# Patient Record
Sex: Female | Born: 1940 | Race: White | Hispanic: No | Marital: Married | Smoking: Never smoker
Health system: Southern US, Community
[De-identification: ages and names within clinical notes are randomized; demographics above are authoritative.]

## PROBLEM LIST (undated history)

## (undated) DIAGNOSIS — R112 Nausea with vomiting, unspecified: Secondary | ICD-10-CM

## (undated) DIAGNOSIS — N183 Chronic kidney disease, stage 3 unspecified: Secondary | ICD-10-CM

## (undated) DIAGNOSIS — Z9889 Other specified postprocedural states: Secondary | ICD-10-CM

## (undated) DIAGNOSIS — R011 Cardiac murmur, unspecified: Secondary | ICD-10-CM

## (undated) DIAGNOSIS — M6740 Ganglion, unspecified site: Secondary | ICD-10-CM

## (undated) DIAGNOSIS — M1990 Unspecified osteoarthritis, unspecified site: Secondary | ICD-10-CM

## (undated) DIAGNOSIS — I10 Essential (primary) hypertension: Secondary | ICD-10-CM

## (undated) DIAGNOSIS — F419 Anxiety disorder, unspecified: Secondary | ICD-10-CM

## (undated) DIAGNOSIS — E7800 Pure hypercholesterolemia, unspecified: Secondary | ICD-10-CM

## (undated) DIAGNOSIS — K219 Gastro-esophageal reflux disease without esophagitis: Secondary | ICD-10-CM

## (undated) HISTORY — DX: Essential (primary) hypertension: I10

## (undated) HISTORY — DX: Anxiety disorder, unspecified: F41.9

## (undated) HISTORY — PX: BLADDER SUSPENSION: SHX72

## (undated) HISTORY — DX: Chronic kidney disease, stage 3 (moderate): N18.3

## (undated) HISTORY — PX: CATARACT EXTRACTION W/ INTRAOCULAR LENS  IMPLANT, BILATERAL: SHX1307

## (undated) HISTORY — DX: Cardiac murmur, unspecified: R01.1

## (undated) HISTORY — DX: Unspecified osteoarthritis, unspecified site: M19.90

## (undated) HISTORY — PX: TONSILLECTOMY: SUR1361

## (undated) HISTORY — DX: Chronic kidney disease, stage 3 unspecified: N18.30

## (undated) HISTORY — DX: Gastro-esophageal reflux disease without esophagitis: K21.9

## (undated) HISTORY — PX: APPENDECTOMY: SHX54

## (undated) HISTORY — PX: REFRACTIVE SURGERY: SHX103

## (undated) HISTORY — PX: ESOPHAGOGASTRODUODENOSCOPY: SHX1529

## (undated) HISTORY — PX: WISDOM TOOTH EXTRACTION: SHX21

---

## 2001-07-08 ENCOUNTER — Other Ambulatory Visit: Admission: RE | Admit: 2001-07-08 | Discharge: 2001-07-08 | Payer: Self-pay | Admitting: Family Medicine

## 2002-10-29 ENCOUNTER — Other Ambulatory Visit: Admission: RE | Admit: 2002-10-29 | Discharge: 2002-10-29 | Payer: Self-pay | Admitting: Obstetrics and Gynecology

## 2003-02-28 ENCOUNTER — Encounter: Payer: Self-pay | Admitting: Obstetrics and Gynecology

## 2003-03-07 ENCOUNTER — Inpatient Hospital Stay (HOSPITAL_COMMUNITY): Admission: RE | Admit: 2003-03-07 | Discharge: 2003-03-09 | Payer: Self-pay | Admitting: Obstetrics and Gynecology

## 2003-03-07 ENCOUNTER — Encounter (INDEPENDENT_AMBULATORY_CARE_PROVIDER_SITE_OTHER): Payer: Self-pay

## 2003-03-07 HISTORY — PX: BILATERAL SALPINGOOPHORECTOMY: SHX1223

## 2003-03-07 HISTORY — PX: VAGINAL HYSTERECTOMY: SHX2639

## 2003-03-07 HISTORY — PX: BLADDER SUSPENSION: SHX72

## 2003-03-07 HISTORY — PX: BLADDER REPAIR: SHX76

## 2005-01-31 ENCOUNTER — Encounter: Admission: RE | Admit: 2005-01-31 | Discharge: 2005-02-21 | Payer: Self-pay | Admitting: Family Medicine

## 2005-03-07 ENCOUNTER — Other Ambulatory Visit: Admission: RE | Admit: 2005-03-07 | Discharge: 2005-03-07 | Payer: Self-pay | Admitting: Obstetrics and Gynecology

## 2005-11-14 ENCOUNTER — Encounter: Admission: RE | Admit: 2005-11-14 | Discharge: 2005-12-03 | Payer: Self-pay | Admitting: Family Medicine

## 2009-03-23 ENCOUNTER — Encounter (INDEPENDENT_AMBULATORY_CARE_PROVIDER_SITE_OTHER): Payer: Self-pay | Admitting: Orthopedic Surgery

## 2009-03-23 ENCOUNTER — Ambulatory Visit (HOSPITAL_BASED_OUTPATIENT_CLINIC_OR_DEPARTMENT_OTHER): Admission: RE | Admit: 2009-03-23 | Discharge: 2009-03-23 | Payer: Self-pay | Admitting: Orthopedic Surgery

## 2009-03-23 HISTORY — PX: TRIGGER FINGER RELEASE: SHX641

## 2009-03-23 HISTORY — PX: DUPUYTREN / PALMAR FASCIOTOMY: SUR601

## 2009-06-20 ENCOUNTER — Ambulatory Visit (HOSPITAL_COMMUNITY): Admission: RE | Admit: 2009-06-20 | Discharge: 2009-06-20 | Payer: Self-pay | Admitting: Gastroenterology

## 2009-07-24 ENCOUNTER — Ambulatory Visit (HOSPITAL_COMMUNITY): Admission: RE | Admit: 2009-07-24 | Discharge: 2009-07-24 | Payer: Self-pay | Admitting: *Deleted

## 2011-02-05 LAB — BASIC METABOLIC PANEL
BUN: 13 mg/dL (ref 6–23)
Calcium: 9.8 mg/dL (ref 8.4–10.5)
Chloride: 104 mEq/L (ref 96–112)
Creatinine, Ser: 1.03 mg/dL (ref 0.4–1.2)

## 2011-02-05 LAB — POCT HEMOGLOBIN-HEMACUE: Hemoglobin: 12.6 g/dL (ref 12.0–15.0)

## 2011-03-12 NOTE — Op Note (Signed)
NAMEJUNIOR, KENEDY         ACCOUNT NO.:  [id number]   MEDICAL RECORD NO.:  [id number]          PATIENT TYPE:  AMB   LOCATION:  DSC                          FACILITY:  [facility]   PHYSICIAN:  Katy Fitch. Sypher, M.D. DATE OF BIRTH:  1941-07-02   DATE OF PROCEDURE:  03/23/2009  DATE OF DISCHARGE:                               OPERATIVE REPORT   PREOPERATIVE DIAGNOSES:  1. Stenosing tenosynovitis of right index finger.  2. Stenosing tenosynovitis of right long finger.  3. 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   POSTOPERATIVE DIAGNOSES:  1. Stenosing tenosynovitis of right index finger.  2. Stenosing tenosynovitis of right long finger.  3. Palmar fibromatosis pretendinous fibers, right long finger.   OPERATION:  1. Excision of palmar fascia including pretendinous fibers to right      long finger and palm.  2. Release of right long finger A1 pulley with synovectomy of      superficialis and profundus tendons.  3. Release of A1 pulley, right index finger with synovectomy of      superficialis and profundus tendons.   OPERATING SURGEON:  Katy Fitch. Sypher, MD   ASSISTANT:  Annye Rusk, PA-C   ANESTHESIA:  2% lidocaine palmar block involving flexor sheath of right  index and long fingers and region of abnormal palmar fascia, right palm.   INDICATIONS:  Kaitlyn Newton is a 70 year old woman referred  through the courtesy of Dr. 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  Clinical examination revealed locking trigger fingers.  She  had very hypertrophic synovium.  We discussed treatment options in  detail.  Given the fact that she had progressive palmar fibromatosis,  she elected to proceed with release of A1 pulleys and excision of her  palmar fascia.   She is brought to the operating at this time anticipating this  procedure.   Preoperatively, she was  advised that with the genetic predisposition to  develop palmar fibromatosis that she will and underlying will develop  more palmar fascia nodules over time.   After informed consent, she was brought to the operating room at this  time.   PROCEDURE NOTE:  Kaitlyn Newton was brought to the operating  room and placed in supine position upon on the operating table.  Following light sedation, the right arm was prepped with Betadine and 2%  lidocaine infiltrated in the region of the flexor sheaths of the index  and long fingers as well as around the palmar fascia nodules.  The arm  was then formally prepped with Betadine soap solution and sterilely  draped.  Following exsanguination of the right arm with an Esmarch  bandage, the arterial tourniquet was inflated to 220 mmHg and later  elevated to 240 mmHg due to mild systolic hypertension.   Procedure commenced with an oblique incision over the A1 pulley of the  right index finger.  Subcutaneous tissues were carefully cleared off the  A1 pulley followed by release of the pulley with scalpel and scissors.  There was a large cuff of fibrotic tenosynovium proximal at  the pulley.  The tendons delivered and a synovectomy of the superficialis and  profundus tendon was accomplished.   Attention was then directed to the long finger.  A Brunner zigzag  incision was fashioned exposing the A1 pulley and proximally into the  palm to completely expose the nodule of pathologic palmar fascia.   A meticulous dissection of the pretendinous fibers of the palmar fascia  was accomplished removing the pathologic nodule and relieving the MP  flexion contracture.  The A1 pulley was then split with scalpel and  scissors along its radial border.  Tendons were delivered and a cuff of  fibrotic tenosynovium removed from the superficialis and profundus  tendons.  Thereafter, full active range of motion of the fingers was  recovered.   The wounds were  then repaired with corner sutures of 5-0 nylon and  interrupted sutures of 5-0 nylon.   A compressive dressing was applied with Steri-Strips, sterile gauze, and  Ace wrap.  There were no apparent complications.  Kaitlyn Newton  tolerated the surgery and anesthesia well.  To return to see [place] in the  office for followup in approximately 1 week.  At that time, we will  change of dressing and removed a portion of her ulnar sutures.      Katy Fitch Sypher, M.D.  Electronically Signed     RVS/MEDQ  D:  03/23/2009  T:  03/24/2009  Job:  [id number]   cc:   Chales Salmon. Abigail Miyamoto, M.D.

## 2011-03-15 NOTE — Op Note (Signed)
NAME:  Kaitlyn Newton, Kaitlyn Newton                ACCOUNT NO.:  [id number]   MEDICAL RECORD NO.:  [id number]                   PATIENT TYPE:  INP   LOCATION:  X010                                 FACILITY:  [facility]   PHYSICIAN:  Maretta Bees. Vonita Moss, M.D.             DATE OF BIRTH:  09-23-1941   DATE OF PROCEDURE:  03/07/2003  DATE OF DISCHARGE:                                 OPERATIVE REPORT   PREOPERATIVE DIAGNOSIS:  Stress urinary incontinence.   POSTOPERATIVE DIAGNOSIS:  Stress urinary incontinence.   PROCEDURE:  SPARC sling system insertion.   SURGEON:  Maretta Bees. Vonita Moss, M.D.   ANESTHESIA:  General.   INDICATIONS FOR PROCEDURE:  This 69 year old lady has had a past history of  stress urinary incontinence. She also has had a past history of chronic  cystitis and some frequency treated with Detrol LA. She has had some bladder  frequency in the past treated with Detrol LA.  She has definite symptoms of  stress incontinence. She is scheduled with Dr. Eda Paschal to undergo vaginal  hysterectomy and anterior repair and in conjunction with that because of the  stress incontinence, she will undergo a SPARC sling insertion.   DESCRIPTION OF PROCEDURE:  I scrubbed in the middle of the case and Dr.  Eda Paschal will dictate his vaginal hysterectomy and anterior repair  separately. I inserted a Foley catheter into the bladder per urethra. The  Foley catheter balloon was palpated and the urethra was identified and  isolated. Visual dissection up toward the endopelvic fascia bilaterally was  performed. Stab wounds were made at each side of the midline in the  suprapubic region and through each of these stab wounds, a SPARC needle  insertion device was placed down to the top of the symphysis pubis and then  marched along the back down to the endopelvic fascia which was then  perforated with the needle and the needle on each side was brought out  periurethrally. I then cystoscoped and there was  no evidence of bladder  injury or needle penetration of the bladder. The urethra was also noted to  be intact. I then snapped on the [facility] sling and brought it up on each side  through the stab wounds using the same needles. I then again cystoscoped and  there was no evidence of sling material in the bladder. I then pulled up the  sling after removing the plastic covers and positioned it so that it was in  mid urethra with a hemostat easily placed between the urethra and the sling  material. The excess sling material was cut off suprapubically and it  retracted into the suprapubic stab wounds  which were then irrigated with antibiotic solution. The vaginal area was  then irrigated with antibiotic solution and with the Foley catheter  reinserted, the sling was again noted to be in position in the mid urethra.  Dr. Eda Paschal then finished his surgery.  Maretta Bees. Vonita Moss, M.D.    LJP/MEDQ  D:  03/07/2003  T:  03/07/2003  Job:  [id number]   cc:   Reuel Boom L. Eda Paschal, M.D.  [address]  [place]  [place] [address]  Fax: [phone]

## 2011-03-15 NOTE — Discharge Summary (Signed)
   NAME:  Kaitlyn Newton, PINHO                ACCOUNT NO.:  [id number]   MEDICAL RECORD NO.:  [id number]                   PATIENT TYPE:  INP   LOCATION:  0448                                 FACILITY:  [facility]   PHYSICIAN:  Daniel L. Eda Paschal, M.D.           DATE OF BIRTH:  21-Apr-1941   DATE OF ADMISSION:  03/07/2003  DATE OF DISCHARGE:  03/09/2003                                 DISCHARGE SUMMARY   HISTORY:  Patient is a 70 year old female who was admitted to the hospital  with urinary stress incontinence, uterine prolapse, cystocele for definitive  surgery.  On the day of admission, she was taken to the operating room by  Dr. Eda Paschal and Dr. Vonita Moss; a vaginal hysterectomy, bilateral salpingo-  oophorectomy, and anterior repair were done by Dr. Eda Paschal, and a [facility]  sling procedure was done by Dr. Vonita Moss.   Postoperatively, the patient did well.  On the second postoperative day, her  catheter was removed, and she was voiding well.  She was discharged home on  Darvocet-N 100 for pain relief and on Cipro to cover her [facility] procedure.  She will be seen in Dr. Enos Fling office in two weeks and in  Dr. Verl Dicker office in four weeks.  Final pathology report revealed the  cervix with no pathological abnormalities, two benign endometrial polyps,  disordered proliferative endometrium, submucosal leiomyoma, adenomyosis,  uterine serosal endometriosis, bilateral ovarian fibrous adhesions, and  bilateral fallopian tubes with no pathological abnormalities.   DIET:  Regular.   ACTIVITY:  On discharge is ambulatory.   CONDITION ON DISCHARGE:  Improved.   DISCHARGE DIAGNOSES:  1. Uterine prolapse.  2. Cystocele.  3. Urinary stress incontinence.  4. Endometriosis.  5. Adenomyosis.  6. Submucous leiomyoma.  7. Endometrial polyps.   OPERATIONS:  1. Vaginal hysterectomy.  2.     Bilateral salpingo-oophorectomy.  3. Anterior repair.  4. SPARC sling procedure.                                        Daniel L. Eda Paschal, M.D.    Tonette Bihari  D:  03/29/2003  T:  03/29/2003  Job:  [id number]   cc:   Maretta Bees. Vonita Moss, M.D.  [address]  [place]  [place] [address]  Fax: [phone]

## 2011-03-15 NOTE — Op Note (Signed)
NAME:  Kaitlyn Newton, Kaitlyn Newton                ACCOUNT NO.:  [id number]   MEDICAL RECORD NO.:  [id number]                   PATIENT TYPE:  INP   LOCATION:  0448                                 FACILITY[facility]   PHYSICIAN:  Daniel L. Eda Paschal, M.D.           DATE OF BIRTH:  27-Jul-1941   DATE OF PROCEDURE:  03/07/2003  DATE OF DISCHARGE:                                 OPERATIVE REPORT   PREOPERATIVE DIAGNOSES:  1. Cystocele.  2. Uterine prolapse.  3. Urinary stress incontinence.   POSTOPERATIVE DIAGNOSES:  1. Cystocele.  2. Uterine prolapse.  3. Urinary stress incontinence.   OPERATION:  1. Vaginal hysterectomy.  2. Bilateral salpingo-oophorectomy.  3. Anterior repair.   SURGEON:  Daniel L. Eda Paschal, M.D.   FIRST ASSISTANT:  Ivor Costa. Farrel Gobble, M.D.   FINDINGS:  At the time of surgery the patient had second-degree uterine  descensus, with a normal-sized uterus.  There was no pathology when the  uterus was removed.  The patient had a 1.5 second-degree cystocele.  The  patient had no rectocele and no enterocele present.  She did have loss of  her urethral vesicle angle.  Ovaries and fallopian tubes were normal, post-  menopausal.   DESCRIPTION OF PROCEDURE:  After adequate general endotracheal anesthesia,  the patient was placed in the dorsal lithotomy position; prepped and draped  in usual sterile manner.  A 1:200,000 insertion of epinephrine and 0.5%  Xylocaine was injected around the cervix.  A 360-degree incision was made  around the cervix.  The bladder was mobilized superiorly, as was the  posterior peritoneum.  The posterior peritoneum and vesicouterine fold of  the perineum were entered by sharp dissection.  The uterosacral ligaments  were clamped, on clamping them they were shortened, and then they were  sutured to the vault laterally for good vault support.  Cardinal ligaments,  uterine arteries, balance of the broad ligament and utero-ovarian ligaments  and  round ligaments were successfully clamped, cut and suture ligated.  All  major vascular bundles were doubly ligated.  Suture material for the above-  mentioned pedicles was #1 chromic catgut.  The uterus was removed and sent  to pathology for tissue diagnosis.   The patient had requested we remove her ovaries and tubes, so that was done  next.  The IP ligaments were identified.  The ovary and tube were grasped  with a Tanja Port -- first on the left then on the right.  The IP ligaments  were clamped and cut, removing the ovaries and the tubes -- first on the  left then on the right.  Then they were doubly suture ligated with #1  chromic catgut.  At this point a modified McCall's enterocele prevention  suture was placed with 2-0 Vicryl.  The vaginal cuff was whipstitched to the  posterior peritoneum with a running, locking 0 Vicryl.  The anterior vaginal  mucosa was then undermined to the level of the urethra.  The perivesical  fascia and the cystocele were sharply dissected free from the mucosa.   At this point Dr. Maretta Bees. Peterson scrubbed in and did a Sparc procedure,  which is dictated in a separate operative note.  After he was finished, the  redundant vaginal mucosa was trimmed away.  The vesicovaginal fascia was  brought together with interrupted 2-0 Vicryl, and then the anterior vaginal  mucosa was closed with a running, locking 2-0 Vicryl.  Copious irrigation  was done with Ringer's lactate.  Two sponge, needle and instrument counts  were correct, and then the cuff was closed with figure-of-eight's and #1  chromic catgut.  The enterocele prevention suture was tied and placed.   The patient was reassessed.  She had good vault support.  She had no  enterocele and she had no rectocele, so the procedure was terminated at this  point.  The vagina was packed with one inch Iodoform.  The patient was  draining clear urine from her Foley catheter.   ESTIMATED BLOOD LOSS:  300 cc with none  replaced.   DISPOSITION:  The patient tolerated the procedure well and left the  operating room in satisfactory condition.                                               Daniel L. Eda Paschal, M.D.    Tonette Bihari  D:  03/07/2003  T:  03/08/2003  Job:  [id number]

## 2014-12-12 ENCOUNTER — Encounter: Payer: Self-pay | Admitting: *Deleted

## 2016-08-15 ENCOUNTER — Other Ambulatory Visit: Payer: Self-pay | Admitting: Orthopedic Surgery

## 2016-09-26 ENCOUNTER — Encounter (HOSPITAL_BASED_OUTPATIENT_CLINIC_OR_DEPARTMENT_OTHER): Payer: Self-pay

## 2016-09-26 ENCOUNTER — Ambulatory Visit (HOSPITAL_BASED_OUTPATIENT_CLINIC_OR_DEPARTMENT_OTHER): Admit: 2016-09-26 | Payer: Self-pay | Admitting: Orthopedic Surgery

## 2016-09-26 SURGERY — EXCISION MASS
Anesthesia: Choice | Laterality: Left

## 2016-10-29 DIAGNOSIS — E781 Pure hyperglyceridemia: Secondary | ICD-10-CM | POA: Diagnosis not present

## 2016-10-29 DIAGNOSIS — K219 Gastro-esophageal reflux disease without esophagitis: Secondary | ICD-10-CM | POA: Diagnosis not present

## 2016-10-29 DIAGNOSIS — E782 Mixed hyperlipidemia: Secondary | ICD-10-CM | POA: Diagnosis not present

## 2016-10-29 DIAGNOSIS — Z0000 Encounter for general adult medical examination without abnormal findings: Secondary | ICD-10-CM | POA: Diagnosis not present

## 2016-10-29 DIAGNOSIS — M85852 Other specified disorders of bone density and structure, left thigh: Secondary | ICD-10-CM | POA: Diagnosis not present

## 2016-10-29 DIAGNOSIS — M2550 Pain in unspecified joint: Secondary | ICD-10-CM | POA: Diagnosis not present

## 2016-10-29 DIAGNOSIS — H409 Unspecified glaucoma: Secondary | ICD-10-CM | POA: Diagnosis not present

## 2016-10-29 DIAGNOSIS — R69 Illness, unspecified: Secondary | ICD-10-CM | POA: Diagnosis not present

## 2016-10-29 DIAGNOSIS — I10 Essential (primary) hypertension: Secondary | ICD-10-CM | POA: Diagnosis not present

## 2016-10-29 DIAGNOSIS — R195 Other fecal abnormalities: Secondary | ICD-10-CM | POA: Diagnosis not present

## 2016-11-04 DIAGNOSIS — I739 Peripheral vascular disease, unspecified: Secondary | ICD-10-CM | POA: Diagnosis not present

## 2016-11-04 DIAGNOSIS — I10 Essential (primary) hypertension: Secondary | ICD-10-CM | POA: Diagnosis not present

## 2016-11-04 DIAGNOSIS — R69 Illness, unspecified: Secondary | ICD-10-CM | POA: Diagnosis not present

## 2016-11-04 DIAGNOSIS — N183 Chronic kidney disease, stage 3 (moderate): Secondary | ICD-10-CM | POA: Diagnosis not present

## 2016-11-04 DIAGNOSIS — Z01419 Encounter for gynecological examination (general) (routine) without abnormal findings: Secondary | ICD-10-CM | POA: Diagnosis not present

## 2016-11-04 DIAGNOSIS — Z7189 Other specified counseling: Secondary | ICD-10-CM | POA: Diagnosis not present

## 2016-11-04 DIAGNOSIS — E782 Mixed hyperlipidemia: Secondary | ICD-10-CM | POA: Diagnosis not present

## 2016-11-04 DIAGNOSIS — L84 Corns and callosities: Secondary | ICD-10-CM | POA: Diagnosis not present

## 2016-11-04 DIAGNOSIS — K219 Gastro-esophageal reflux disease without esophagitis: Secondary | ICD-10-CM | POA: Diagnosis not present

## 2016-11-04 DIAGNOSIS — Z0000 Encounter for general adult medical examination without abnormal findings: Secondary | ICD-10-CM | POA: Diagnosis not present

## 2016-12-26 ENCOUNTER — Encounter (HOSPITAL_BASED_OUTPATIENT_CLINIC_OR_DEPARTMENT_OTHER): Payer: Self-pay | Admitting: *Deleted

## 2016-12-26 DIAGNOSIS — M6740 Ganglion, unspecified site: Secondary | ICD-10-CM

## 2016-12-26 HISTORY — DX: Ganglion, unspecified site: M67.40

## 2016-12-26 NOTE — Pre-Procedure Instructions (Signed)
To come for BMET and EKG 

## 2016-12-27 ENCOUNTER — Encounter (HOSPITAL_BASED_OUTPATIENT_CLINIC_OR_DEPARTMENT_OTHER)
Admission: RE | Admit: 2016-12-27 | Discharge: 2016-12-27 | Disposition: A | Discharge status: Home / Self Care | Payer: Medicare HMO | Source: Ambulatory Visit | Attending: Orthopedic Surgery | Admitting: Orthopedic Surgery

## 2016-12-27 DIAGNOSIS — Z01812 Encounter for preprocedural laboratory examination: Secondary | ICD-10-CM | POA: Insufficient documentation

## 2016-12-27 DIAGNOSIS — I10 Essential (primary) hypertension: Secondary | ICD-10-CM | POA: Diagnosis not present

## 2016-12-27 DIAGNOSIS — Z01810 Encounter for preprocedural cardiovascular examination: Secondary | ICD-10-CM | POA: Diagnosis not present

## 2016-12-27 LAB — BASIC METABOLIC PANEL
Anion gap: 11 (ref 5–15)
BUN: 13 mg/dL (ref 6–20)
CO2: 23 mmol/L (ref 22–32)
Calcium: 9.5 mg/dL (ref 8.9–10.3)
Chloride: 97 mmol/L — ABNORMAL LOW (ref 101–111)
Creatinine, Ser: 0.93 mg/dL (ref 0.44–1.00)
GFR calc Af Amer: >60 mL/min (ref >60)
GFR, EST NON AFRICAN AMERICAN: 58 mL/min — AB (ref >60)
GLUCOSE: 88 mg/dL (ref 65–99)
POTASSIUM: 4 mmol/L (ref 3.5–5.1)
SODIUM: 131 mmol/L — AB (ref 135–145)

## 2016-12-27 NOTE — Progress Notes (Addendum)
Dr. Lissa Hoard reviewed EKG - [facility] for surgery. Dr. Lissa Hoard notified of Sodium 131 and Cl 97, reviewed history, previous labs. Ordered to get I stat  On day of surgery.

## 2016-12-30 ENCOUNTER — Other Ambulatory Visit: Payer: Self-pay | Admitting: Orthopedic Surgery

## 2017-01-01 ENCOUNTER — Other Ambulatory Visit: Payer: Self-pay | Admitting: Orthopedic Surgery

## 2017-01-02 ENCOUNTER — Encounter (HOSPITAL_BASED_OUTPATIENT_CLINIC_OR_DEPARTMENT_OTHER)
Admission: RE | Disposition: A | Discharge status: Home / Self Care | Payer: Self-pay | Source: Ambulatory Visit | Attending: Orthopedic Surgery

## 2017-01-02 ENCOUNTER — Ambulatory Visit (HOSPITAL_BASED_OUTPATIENT_CLINIC_OR_DEPARTMENT_OTHER)
Admission: RE | Admit: 2017-01-02 | Discharge: 2017-01-02 | Disposition: A | Discharge status: Home / Self Care | Payer: Medicare HMO | Source: Ambulatory Visit | Attending: Orthopedic Surgery | Admitting: Orthopedic Surgery

## 2017-01-02 ENCOUNTER — Encounter (HOSPITAL_BASED_OUTPATIENT_CLINIC_OR_DEPARTMENT_OTHER): Payer: Self-pay | Admitting: *Deleted

## 2017-01-02 ENCOUNTER — Ambulatory Visit (HOSPITAL_BASED_OUTPATIENT_CLINIC_OR_DEPARTMENT_OTHER): Payer: Medicare HMO | Admitting: Anesthesiology

## 2017-01-02 DIAGNOSIS — F419 Anxiety disorder, unspecified: Secondary | ICD-10-CM | POA: Insufficient documentation

## 2017-01-02 DIAGNOSIS — Z8249 Family history of ischemic heart disease and other diseases of the circulatory system: Secondary | ICD-10-CM | POA: Diagnosis not present

## 2017-01-02 DIAGNOSIS — N183 Chronic kidney disease, stage 3 (moderate): Secondary | ICD-10-CM | POA: Diagnosis not present

## 2017-01-02 DIAGNOSIS — I129 Hypertensive chronic kidney disease with stage 1 through stage 4 chronic kidney disease, or unspecified chronic kidney disease: Secondary | ICD-10-CM | POA: Diagnosis not present

## 2017-01-02 DIAGNOSIS — E7800 Pure hypercholesterolemia, unspecified: Secondary | ICD-10-CM | POA: Diagnosis not present

## 2017-01-02 DIAGNOSIS — K219 Gastro-esophageal reflux disease without esophagitis: Secondary | ICD-10-CM | POA: Diagnosis not present

## 2017-01-02 DIAGNOSIS — M71342 Other bursal cyst, left hand: Secondary | ICD-10-CM | POA: Diagnosis not present

## 2017-01-02 DIAGNOSIS — M67442 Ganglion, left hand: Secondary | ICD-10-CM | POA: Insufficient documentation

## 2017-01-02 DIAGNOSIS — M19042 Primary osteoarthritis, left hand: Secondary | ICD-10-CM | POA: Insufficient documentation

## 2017-01-02 DIAGNOSIS — Z90710 Acquired absence of both cervix and uterus: Secondary | ICD-10-CM | POA: Diagnosis not present

## 2017-01-02 DIAGNOSIS — R69 Illness, unspecified: Secondary | ICD-10-CM | POA: Diagnosis not present

## 2017-01-02 DIAGNOSIS — M151 Heberden's nodes (with arthropathy): Secondary | ICD-10-CM | POA: Diagnosis not present

## 2017-01-02 HISTORY — DX: Nausea with vomiting, unspecified: R11.2

## 2017-01-02 HISTORY — DX: Other specified postprocedural states: Z98.890

## 2017-01-02 HISTORY — PX: I & D EXTREMITY: SHX5045

## 2017-01-02 HISTORY — DX: Pure hypercholesterolemia, unspecified: E78.00

## 2017-01-02 HISTORY — DX: Ganglion, unspecified site: M67.40

## 2017-01-02 HISTORY — PX: MASS EXCISION: SHX2000

## 2017-01-02 LAB — POCT I-STAT, CHEM 8
BUN: 17 mg/dL (ref 6–20)
CREATININE: 0.9 mg/dL (ref 0.44–1.00)
Calcium, Ion: 1.32 mmol/L (ref 1.15–1.40)
Chloride: 100 mmol/L — ABNORMAL LOW (ref 101–111)
Glucose, Bld: 95 mg/dL (ref 65–99)
HEMATOCRIT: 38 % (ref 36.0–46.0)
Hemoglobin: 12.9 g/dL (ref 12.0–15.0)
Potassium: 3.8 mmol/L (ref 3.5–5.1)
SODIUM: 139 mmol/L (ref 135–145)
TCO2: 25 mmol/L (ref 0–100)

## 2017-01-02 SURGERY — EXCISION MASS
Anesthesia: General

## 2017-01-02 MED ORDER — MIDAZOLAM HCL 2 MG/2ML IJ SOLN: 1.0000 mg | INTRAMUSCULAR | Status: DC | PRN

## 2017-01-02 MED ORDER — CHLORHEXIDINE GLUCONATE 4 % EX LIQD: 60.0000 mL | Freq: Once | CUTANEOUS | Status: DC

## 2017-01-02 MED ORDER — VANCOMYCIN HCL IN DEXTROSE 1-5 GM/200ML-% IV SOLN
INTRAVENOUS | Status: AC
  Filled 2017-01-02: qty 200

## 2017-01-02 MED ORDER — VANCOMYCIN HCL IN DEXTROSE 1-5 GM/200ML-% IV SOLN
1000.0000 mg | INTRAVENOUS | Status: AC
  Administered 2017-01-02: 1000 mg via INTRAVENOUS

## 2017-01-02 MED ORDER — SCOPOLAMINE 1 MG/3DAYS TD PT72: 1.0000 | MEDICATED_PATCH | Freq: Once | TRANSDERMAL | Status: DC | PRN

## 2017-01-02 MED ORDER — ONDANSETRON HCL 4 MG/2ML IJ SOLN: 4.0000 mg | Freq: Once | INTRAMUSCULAR | Status: DC | PRN

## 2017-01-02 MED ORDER — FENTANYL CITRATE (PF) 100 MCG/2ML IJ SOLN
50.0000 ug | INTRAMUSCULAR | Status: DC | PRN
  Administered 2017-01-02 (×2): 50 ug via INTRAVENOUS

## 2017-01-02 MED ORDER — PROPOFOL 500 MG/50ML IV EMUL
INTRAVENOUS | Status: AC
  Filled 2017-01-02: qty 50

## 2017-01-02 MED ORDER — LIDOCAINE 2% (20 MG/ML) 5 ML SYRINGE
INTRAMUSCULAR | Status: DC | PRN
  Administered 2017-01-02: 50 mg via INTRAVENOUS

## 2017-01-02 MED ORDER — FENTANYL CITRATE (PF) 100 MCG/2ML IJ SOLN: 25.0000 ug | INTRAMUSCULAR | Status: DC | PRN

## 2017-01-02 MED ORDER — BUPIVACAINE HCL (PF) 0.25 % IJ SOLN
INTRAMUSCULAR | Status: DC | PRN
  Administered 2017-01-02: 10 mL

## 2017-01-02 MED ORDER — OXYCODONE HCL 5 MG/5ML PO SOLN
5.0000 mg | Freq: Once | ORAL | Status: DC | PRN
Start: 2017-01-02 — End: 2017-01-02

## 2017-01-02 MED ORDER — PROPOFOL 10 MG/ML IV BOLUS
INTRAVENOUS | Status: DC | PRN
  Administered 2017-01-02: 100 mg via INTRAVENOUS

## 2017-01-02 MED ORDER — FENTANYL CITRATE (PF) 100 MCG/2ML IJ SOLN
INTRAMUSCULAR | Status: AC
  Filled 2017-01-02: qty 2

## 2017-01-02 MED ORDER — LIDOCAINE 2% (20 MG/ML) 5 ML SYRINGE
INTRAMUSCULAR | Status: AC
  Filled 2017-01-02: qty 5

## 2017-01-02 MED ORDER — LACTATED RINGERS IV SOLN
INTRAVENOUS | Status: DC
  Administered 2017-01-02: 10 mL/h via INTRAVENOUS

## 2017-01-02 MED ORDER — HYDROCODONE-ACETAMINOPHEN 5-325 MG PO TABS: ORAL_TABLET | ORAL | 0 refills | Status: DC

## 2017-01-02 MED ORDER — ONDANSETRON HCL 4 MG/2ML IJ SOLN
INTRAMUSCULAR | Status: DC | PRN
  Administered 2017-01-02: 4 mg via INTRAVENOUS

## 2017-01-02 MED ORDER — DEXAMETHASONE SODIUM PHOSPHATE 10 MG/ML IJ SOLN
INTRAMUSCULAR | Status: AC
  Filled 2017-01-02: qty 1

## 2017-01-02 MED ORDER — DEXAMETHASONE SODIUM PHOSPHATE 10 MG/ML IJ SOLN
INTRAMUSCULAR | Status: DC | PRN
  Administered 2017-01-02: 10 mg via INTRAVENOUS

## 2017-01-02 MED ORDER — OXYCODONE HCL 5 MG PO TABS: 5.0000 mg | ORAL_TABLET | Freq: Once | ORAL | Status: DC | PRN

## 2017-01-02 MED ORDER — ONDANSETRON HCL 4 MG/2ML IJ SOLN
INTRAMUSCULAR | Status: AC
  Filled 2017-01-02: qty 2

## 2017-01-02 SURGICAL SUPPLY — 58 items
BAG DECANTER FOR FLEXI CONT (MISCELLANEOUS) IMPLANT
BANDAGE ACE 3X5.8 VEL STRL LF (GAUZE/BANDAGES/DRESSINGS) IMPLANT
BANDAGE COBAN STERILE 2 (GAUZE/BANDAGES/DRESSINGS) IMPLANT
BENZOIN TINCTURE PRP APPL 2/3 (GAUZE/BANDAGES/DRESSINGS) IMPLANT
BLADE MINI RND TIP GREEN BEAV (BLADE) IMPLANT
BLADE SURG 15 STRL LF DISP TIS (BLADE) ×2 IMPLANT
BLADE SURG 15 STRL SS (BLADE) ×1
BNDG COHESIVE 1X5 TAN STRL LF (GAUZE/BANDAGES/DRESSINGS) ×3 IMPLANT
BNDG CONFORM 2 STRL LF (GAUZE/BANDAGES/DRESSINGS) IMPLANT
BNDG ELASTIC 2X5.8 VLCR STR LF (GAUZE/BANDAGES/DRESSINGS) IMPLANT
BNDG ESMARK 4X9 LF (GAUZE/BANDAGES/DRESSINGS) ×3 IMPLANT
BNDG GAUZE 1X2.1 STRL (MISCELLANEOUS) IMPLANT
BNDG GAUZE ELAST 4 BULKY (GAUZE/BANDAGES/DRESSINGS) ×3 IMPLANT
BNDG PLASTER X FAST 3X3 WHT LF (CAST SUPPLIES) IMPLANT
BRUSH SCRUB EZ PLAIN DRY (MISCELLANEOUS) IMPLANT
CHLORAPREP W/TINT 26ML (MISCELLANEOUS) ×3 IMPLANT
CORDS BIPOLAR (ELECTRODE) ×3 IMPLANT
COVER BACK TABLE 60X90IN (DRAPES) ×3 IMPLANT
COVER MAYO STAND STRL (DRAPES) ×3 IMPLANT
CUFF TOURNIQUET SINGLE 18IN (TOURNIQUET CUFF) ×3 IMPLANT
DRAPE EXTREMITY T 121X128X90 (DRAPE) ×3 IMPLANT
DRAPE SURG 17X23 STRL (DRAPES) ×3 IMPLANT
GAUZE PACKING IODOFORM 1/4X15 (GAUZE/BANDAGES/DRESSINGS) IMPLANT
GAUZE SPONGE 4X4 12PLY STRL (GAUZE/BANDAGES/DRESSINGS) ×3 IMPLANT
GAUZE XEROFORM 1X8 LF (GAUZE/BANDAGES/DRESSINGS) ×3 IMPLANT
GLOVE BIO SURGEON STRL SZ7.5 (GLOVE) ×3 IMPLANT
GLOVE BIOGEL PI IND STRL 8 (GLOVE) ×2 IMPLANT
GLOVE BIOGEL PI INDICATOR 8 (GLOVE) ×1
GOWN STRL REUS W/ TWL LRG LVL3 (GOWN DISPOSABLE) ×2 IMPLANT
GOWN STRL REUS W/TWL LRG LVL3 (GOWN DISPOSABLE) ×1
GOWN STRL REUS W/TWL XL LVL3 (GOWN DISPOSABLE) ×3 IMPLANT
LOOP VESSEL MAXI BLUE (MISCELLANEOUS) IMPLANT
NEEDLE HYPO 25X1 1.5 SAFETY (NEEDLE) ×3 IMPLANT
NS IRRIG 1000ML POUR BTL (IV SOLUTION) ×3 IMPLANT
PACK BASIN DAY SURGERY FS (CUSTOM PROCEDURE TRAY) ×3 IMPLANT
PAD CAST 3X4 CTTN HI CHSV (CAST SUPPLIES) IMPLANT
PAD CAST 4YDX4 CTTN HI CHSV (CAST SUPPLIES) IMPLANT
PADDING CAST ABS 4INX4YD NS (CAST SUPPLIES) ×1
PADDING CAST ABS COTTON 4X4 ST (CAST SUPPLIES) ×2 IMPLANT
PADDING CAST COTTON 3X4 STRL (CAST SUPPLIES)
PADDING CAST COTTON 4X4 STRL (CAST SUPPLIES)
SPLINT PLASTER CAST XFAST 3X15 (CAST SUPPLIES) IMPLANT
SPLINT PLASTER XTRA FASTSET 3X (CAST SUPPLIES)
STOCKINETTE 4X48 STRL (DRAPES) ×3 IMPLANT
STRIP CLOSURE SKIN 1/2X4 (GAUZE/BANDAGES/DRESSINGS) IMPLANT
SUT ETHILON 3 0 PS 1 (SUTURE) IMPLANT
SUT ETHILON 4 0 PS 2 18 (SUTURE) ×3 IMPLANT
SUT ETHILON 5 0 P 3 18 (SUTURE)
SUT NYLON ETHILON 5-0 P-3 1X18 (SUTURE) IMPLANT
SUT VIC AB 4-0 P2 18 (SUTURE) IMPLANT
SWAB COLLECTION DEVICE MRSA (MISCELLANEOUS) IMPLANT
SWAB CULTURE ESWAB REG 1ML (MISCELLANEOUS) IMPLANT
SYR BULB 3OZ (MISCELLANEOUS) ×3 IMPLANT
SYR CONTROL 10ML LL (SYRINGE) ×3 IMPLANT
TOWEL OR 17X24 6PK STRL BLUE (TOWEL DISPOSABLE) ×3 IMPLANT
TRAY DSU PREP LF (CUSTOM PROCEDURE TRAY) ×3 IMPLANT
TUBE FEEDING ENTERAL 5FR 16IN (TUBING) IMPLANT
UNDERPAD 30X30 (UNDERPADS AND DIAPERS) ×3 IMPLANT

## 2017-01-02 NOTE — Transfer of Care (Signed)
Immediate Anesthesia Transfer of Care Note  Patient: Kaitlyn Newton  Procedure(s) Performed: Procedure(s): LEFT LONG FINGER EXCISION MASS (Left) DEBRIDEMENT DIP JOINT (N/A)  Patient Location: PACU  Anesthesia Type:General  Level of Consciousness: awake and sedated  Airway & Oxygen Therapy: Patient Spontanous Breathing and Patient connected to face mask oxygen  Post-op Assessment: Report given to RN and Post -op Vital signs reviewed and stable  Post vital signs: Reviewed and stable  Last Vitals:  Vitals:   01/02/17 1106 01/02/17 1107  BP:    Pulse: 67 67  Resp:  11  Temp:      Last Pain:  Vitals:   01/02/17 0904  TempSrc: Oral         Complications: No apparent anesthesia complications

## 2017-01-02 NOTE — Transfer of Care (Deleted)
Immediate Anesthesia Transfer of Care Note  Patient: Kaitlyn Newton  Procedure(s) Performed: Procedure(s): LEFT LONG FINGER EXCISION MASS (Left) DEBRIDEMENT DIP JOINT POSSIBLE ROTATION FLAP (N/A)  Patient Location: PACU  Anesthesia Type:MAC and Bier block  Level of Consciousness: awake and sedated  Airway & Oxygen Therapy: Patient Spontanous Breathing and Patient connected to face mask oxygen  Post-op Assessment: Report given to RN and Post -op Vital signs reviewed and stable  Post vital signs: Reviewed and stable  Last Vitals:  Vitals:   01/02/17 0904  BP: (!) 146/56  Pulse: 68  Resp: 18  Temp: 36.7 C    Last Pain:  Vitals:   01/02/17 0904  TempSrc: Oral         Complications: No apparent anesthesia complications

## 2017-01-02 NOTE — Op Note (Signed)
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  PROCEDURE:   1. Left long finger excision of mucoid cyst 2. Left long finger debridement of DIP joint including osteophyte from middle phalanx and prominent middle phalanx condyle.  SURGEON:  Leanora Cover, MD.  ASSISTANT:  None.  ANESTHESIA:  General.  IV FLUIDS:  Per Anesthesia flow sheet.  ESTIMATED BLOOD LOSS:  Minimal.  COMPLICATIONS:  None.  SPECIMENS:  Cyst to Pathology.  TOURNIQUET TIME:  15 minutes.  DISPOSITION:  Stable to PACU.  INDICATIONS:  Ms. Minner is a 76 year old female who has noted a mass on the left long finger.  It was bothersome to her.  She wished to have it removed and the DIP joint debrided to try to prevent recurrence. Risks, benefits, and alternatives of surgery were discussed including risk of blood loss; infection; damage to nerves, vessels, tendons, ligaments, bone; failure of surgery; need for additional surgery; complications with wound healing; continued pain; recurrence of mass. She voiced understanding of these risks and elected to proceed.  OPERATIVE COURSE:  After being identified preoperatively by myself, the patient and I agreed upon procedure and site of procedure.  Surgical site was marked.  Risks, benefits, and alternatives of surgery were reviewed and she wished to proceed.  Surgical consent had been signed. She was given IV Ancef as preoperative antibiotic prophylaxis.  She was transferred to the operating room and placed on the operating room table in supine  position with left upper extremity on arm board.  General anesthesia was induced by anesthesiologist.  Left upper extremity was prepped and draped in normal sterile orthopedic fashion.  Surgical pause was performed between surgeon, anesthesia, and operating staff; and all were in agreement as to the patient, procedure, and site of procedure. Tourniquet at the proximal aspect of the extremity was inflated to 250 mmHg after exsanguination of the limb with an Esmarch bandage.  A hockey- stick shaped incision was made at the DIP joint of the left long finger. This was carried into subcutaneous tissues by spreading technique.  The mass was easily identified.  It was cleared of soft tissue attachments and removed.  Stalk was removed as well.  The DIP joint was entered underneath the extensor tendon.  There was a large dorsal osteophyte on the middle phalanx, which was removed with rongeurs.  The prominent dorsal condyles of the middle phalanx were also rongeured down.  The radial side was the more prominent condyles.  This provided better contour to the finger.  The wound was copiously irrigated with sterile saline.  It was then closed with 4-0 nylon in a horizontal mattress fashion.  The mass was sent to Pathology for examination.  A digital  block was performed with 10 mL of 0.25% plain Marcaine to aid in postoperative analgesia.  The wound was then dressed with sterile Xeroform, 4x4s, and wrapped with a Coban dressing lightly.  Alumafoam splint was placed and wrapped lightly with Coban dressing.  Tourniquet was deflated at 15 minutes.  Fingertips were pink with brisk capillary refill after deflation of tourniquet.  Operative drapes were broken down.  The patient was awoken from anesthesia safely.  She was transferred back to stretcher and taken to PACU in stable condition.  We will see her back in the office in 1 week for postoperative followup, however, Norco 5/325 one to two p.o. q.6 hours  p.r.n. pain dispensed #20.     Leanora Cover, MD     KK/MEDQ  D:  01/02/2017  T:  01/02/2017  Job:  [id number]

## 2017-01-02 NOTE — Anesthesia Preprocedure Evaluation (Signed)
Anesthesia Evaluation  Patient identified by MRN, date of birth, ID band Patient awake    Reviewed: Allergy & Precautions, NPO status , Patient's Chart, lab work & pertinent test results  Airway Mallampati: II  TM Distance: >3 FB Neck ROM: Full    Dental  (+) Teeth Intact, Dental Advisory Given   Pulmonary    breath sounds clear to auscultation       Cardiovascular hypertension,  Rhythm:Regular Rate:Normal     Neuro/Psych    GI/Hepatic   Endo/Other    Renal/GU      Musculoskeletal   Abdominal   Peds  Hematology   Anesthesia Other Findings   Reproductive/Obstetrics                            Anesthesia Physical Anesthesia Plan  ASA: II  Anesthesia Plan: General   Post-op Pain Management:    Induction: Intravenous  Airway Management Planned: LMA  Additional Equipment:   Intra-op Plan:   Post-operative Plan:   Informed Consent: I have reviewed the patients History and Physical, chart, labs and discussed the procedure including the risks, benefits and alternatives for the proposed anesthesia with the patient or authorized representative who has indicated his/her understanding and acceptance.   Dental advisory given  Plan Discussed with: CRNA and Anesthesiologist  Anesthesia Plan Comments:         Anesthesia Quick Evaluation  

## 2017-01-02 NOTE — H&P (Signed)
Kaitlyn Newton is an 76 y.o. female.   Chief Complaint: left long mucoid cyst HPI: 76 yo female with left long finger mucoid cyst and dip joint arthritis.  It is bothersome to her and she wishes to have the cyst removed and the dip joint debrided to try to prevent recurrence.  Allergies:  Allergies  Allergen Reactions  . Ceclor [Cefaclor] Swelling    SWELLING OF FACE  . Macrodantin [Nitrofurantoin] Swelling    SWELLING OF FACE    Past Medical History:  Diagnosis Date  . Anxiety   . Arthritis    hands  . CKD (chronic kidney disease), stage III    no nephrologist  . GERD (gastroesophageal reflux disease)   . Heart murmur    no cardiologist  . High cholesterol   . HTN (hypertension)    states under control with meds., has been on med. since 1990s  . Mucoid cyst of joint 12/2016   left long finger  . PONV (postoperative nausea and vomiting)    after appendectomy with ether    Past Surgical History:  Procedure Laterality Date  . APPENDECTOMY    . BILATERAL SALPINGOOPHORECTOMY  03/07/2003  . BLADDER REPAIR  03/07/2003   anterior repair  . BLADDER SUSPENSION    . BLADDER SUSPENSION  03/07/2003   SPARC sling  . CATARACT EXTRACTION W/ INTRAOCULAR LENS  IMPLANT, BILATERAL Bilateral   . DUPUYTREN / PALMAR FASCIOTOMY Right 03/23/2009  . ESOPHAGOGASTRODUODENOSCOPY    . REFRACTIVE SURGERY Bilateral   . TONSILLECTOMY    . TRIGGER FINGER RELEASE Right 03/23/2009   index and long fingers  . VAGINAL HYSTERECTOMY  03/07/2003    Family History: Family History  Problem Relation Age of Onset  . Depression Father   . CAD Father   . CVA Mother   . CAD Mother   . Gout Sister   . Heart Problems Son     degenerate bones and heart  . Other Sister     respiratory issues and lung surgery  . Arthritis Son     Social History:   reports that she has never smoked. She has never used smokeless tobacco. She reports that she does not drink alcohol or use  drugs.  Medications: No prescriptions prior to admission.    No results found for this or any previous visit (from the past 48 hour(s)).  No results found.   A comprehensive review of systems was negative.  Height 5\' 1"  (1.549 m), weight 54 kg (119 lb).  General appearance: alert, cooperative and appears stated age Head: Normocephalic, without obvious abnormality, atraumatic Neck: supple, symmetrical, trachea midline Resp: clear to auscultation bilaterally Cardio: regular rate and rhythm GI: non-tender Extremities: Intact sensation and capillary refill all digits.  +epl/fpl/io.  No wounds.  Pulses: 2+ and symmetric Skin: Skin color, texture, turgor normal. 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  Non operative and operative treatment options were discussed with the patient and patient wishes to proceed with operative treatment. Risks, benefits, and alternatives of surgery were discussed and the patient agrees with the plan of care.   [name] 01/02/2017, 7:36 AM

## 2017-01-02 NOTE — Anesthesia Postprocedure Evaluation (Addendum)
Anesthesia Post Note  Patient: Kaitlyn Newton  Procedure(s) Performed: Procedure(s) (LRB): LEFT LONG FINGER EXCISION MASS (Left) DEBRIDEMENT DIP JOINT (N/A)  Patient location during evaluation: PACU Anesthesia Type: General Level of consciousness: awake, awake and alert and oriented Pain management: pain level controlled Vital Signs Assessment: post-procedure vital signs reviewed and stable Respiratory status: spontaneous breathing Cardiovascular status: blood pressure returned to baseline Anesthetic complications: no       Last Vitals:  Vitals:   01/02/17 1145 01/02/17 1207  BP: (!) 150/65 (!) 126/51  Pulse: 63 64  Resp: 15 18  Temp:  36.6 C    Last Pain:  Vitals:   01/02/17 1207  TempSrc:   PainSc: 0-No pain                 Demarus Latterell COKER

## 2017-01-02 NOTE — Anesthesia Procedure Notes (Signed)
Procedure Name: LMA Insertion Performed by: Maile Linford W Pre-anesthesia Checklist: Patient identified, Emergency Drugs available, Suction available and Patient being monitored Patient Re-evaluated:Patient Re-evaluated prior to inductionOxygen Delivery Method: Circle system utilized Preoxygenation: Pre-oxygenation with 100% oxygen Intubation Type: IV induction Ventilation: Mask ventilation without difficulty LMA: LMA inserted LMA Size: 4.0 Number of attempts: 1 Placement Confirmation: positive ETCO2 Tube secured with: Tape Dental Injury: Teeth and Oropharynx as per pre-operative assessment        

## 2017-01-02 NOTE — Op Note (Signed)
[id number] 

## 2017-01-02 NOTE — Brief Op Note (Signed)
01/02/2017  11:02 AM  PATIENT:  Kaitlyn Newton  76 y.o. 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  PROCEDURE:  Procedure(s): LEFT LONG FINGER EXCISION MASS (Left) DEBRIDEMENT DIP JOINT (N/A)  SURGEON:  Surgeon(s) and Role:    * Leanora Cover, MD - Primary  PHYSICIAN ASSISTANT:   ASSISTANTS: none   ANESTHESIA:   general  EBL:  Total I/O In: -  Out: 2 [Blood:2]  BLOOD ADMINISTERED:none  DRAINS: none   LOCAL MEDICATIONS USED:  MARCAINE     SPECIMEN:  Source of Specimen:  left long finger  DISPOSITION OF SPECIMEN:  PATHOLOGY  COUNTS:  YES  TOURNIQUET:   Total Tourniquet Time Documented: Upper Arm (Left) - 15 minutes Total: Upper Arm (Left) - 15 minutes   DICTATION: .Other Dictation: Dictation Number [id number]  PLAN OF CARE: Discharge to home after PACU  PATIENT DISPOSITION:  PACU - hemodynamically stable.

## 2017-01-02 NOTE — Discharge Instructions (Addendum)

## 2017-01-05 ENCOUNTER — Encounter (HOSPITAL_BASED_OUTPATIENT_CLINIC_OR_DEPARTMENT_OTHER): Payer: Self-pay | Admitting: Orthopedic Surgery

## 2017-01-10 DIAGNOSIS — M180 Bilateral primary osteoarthritis of first carpometacarpal joints: Secondary | ICD-10-CM | POA: Diagnosis not present

## 2017-01-13 DIAGNOSIS — Z1231 Encounter for screening mammogram for malignant neoplasm of breast: Secondary | ICD-10-CM | POA: Diagnosis not present

## 2017-01-16 DIAGNOSIS — L820 Inflamed seborrheic keratosis: Secondary | ICD-10-CM | POA: Diagnosis not present

## 2017-01-16 DIAGNOSIS — L821 Other seborrheic keratosis: Secondary | ICD-10-CM | POA: Diagnosis not present

## 2017-01-16 DIAGNOSIS — D225 Melanocytic nevi of trunk: Secondary | ICD-10-CM | POA: Diagnosis not present

## 2017-01-16 DIAGNOSIS — B351 Tinea unguium: Secondary | ICD-10-CM | POA: Diagnosis not present

## 2017-01-16 DIAGNOSIS — D2361 Other benign neoplasm of skin of right upper limb, including shoulder: Secondary | ICD-10-CM | POA: Diagnosis not present

## 2017-01-28 ENCOUNTER — Ambulatory Visit (INDEPENDENT_AMBULATORY_CARE_PROVIDER_SITE_OTHER): Payer: Medicare HMO | Admitting: Sports Medicine

## 2017-01-28 ENCOUNTER — Ambulatory Visit (INDEPENDENT_AMBULATORY_CARE_PROVIDER_SITE_OTHER): Payer: Medicare HMO

## 2017-01-28 ENCOUNTER — Encounter: Payer: Self-pay | Admitting: Sports Medicine

## 2017-01-28 DIAGNOSIS — M2042 Other hammer toe(s) (acquired), left foot: Secondary | ICD-10-CM

## 2017-01-28 DIAGNOSIS — L84 Corns and callosities: Secondary | ICD-10-CM | POA: Diagnosis not present

## 2017-01-28 DIAGNOSIS — M2041 Other hammer toe(s) (acquired), right foot: Secondary | ICD-10-CM

## 2017-01-28 DIAGNOSIS — M79671 Pain in right foot: Secondary | ICD-10-CM

## 2017-01-28 DIAGNOSIS — M79672 Pain in left foot: Secondary | ICD-10-CM | POA: Diagnosis not present

## 2017-01-28 DIAGNOSIS — M792 Neuralgia and neuritis, unspecified: Secondary | ICD-10-CM | POA: Diagnosis not present

## 2017-01-28 NOTE — Progress Notes (Signed)
Subjective: Kaitlyn Newton is a 76 y.o. female patient who presents to office for evaluation of Right> Left foot pain secondary to callus skin. Patient complains of pain at the lesion present Right>Left foot at the 4-5 toes and to bottom of feet feels like stinging and burning and hot/red on bottom. Patient has tried foam spacers with some improvement but pain is worse in bottom of feet with walking on treadmill. Patient denies any other pedal complaints.   There are no active problems to display for this patient.   Current Outpatient Prescriptions on File Prior to Visit  Medication Sig Dispense Refill  . aspirin EC 81 MG tablet Take 81 mg by mouth daily.    . calcium-vitamin D (OSCAL WITH D) 500-200 MG-UNIT tablet Take 1 tablet by mouth 2 (two) times daily.    . cholecalciferol (VITAMIN D) 1000 units tablet Take 2,000 Units by mouth daily.    . clonazePAM (KLONOPIN) 0.5 MG tablet Take 0.5 mg by mouth at bedtime.     . folic acid (FOLVITE) 093 MCG tablet Take 800 mcg by mouth daily.     Marland Kitchen glucosamine-chondroitin 500-400 MG tablet Take 1 tablet by mouth 2 (two) times daily.    Marland Kitchen HYDROcodone-acetaminophen (NORCO) 5-325 MG tablet 1-2 tabs po q6 hours prn pain 20 tablet 0  . lisinopril-hydrochlorothiazide (PRINZIDE,ZESTORETIC) 20-12.5 MG tablet Take 2 tablets by mouth daily.    . Magnesium 500 MG CAPS Take 500 mg by mouth daily.    . metoprolol succinate (TOPROL-XL) 25 MG 24 hr tablet Take 25 mg by mouth daily.    Marland Kitchen omeprazole (PRILOSEC) 20 MG capsule Take 40 mg by mouth daily.    . rosuvastatin (CRESTOR) 10 MG tablet Take 10 mg by mouth daily.    . vitamin E 400 UNIT capsule Take 400 Units by mouth daily.     No current facility-administered medications on file prior to visit.     Allergies  Allergen Reactions  . Ceclor [Cefaclor] Swelling    SWELLING OF FACE  . Macrodantin [Nitrofurantoin] Swelling    SWELLING OF FACE  . Vancomycin Rash    Objective:  General: Alert and  oriented x3 in no acute distress  Dermatology: Keratotic lesion present 4th interspace R>L with skin lines transversing the lesion, pain is present with direct pressure to the lesion with a central nucleated core noted, no webspace macerations, no ecchymosis bilateral, all nails x 10 are well manicured.  Vascular: Dorsalis Pedis and Posterior Tibial pedal pulses 1/4, Capillary Fill Time 3 seconds, scant pedal hair growth bilateral, no edema bilateral lower extremities, Temperature gradient within normal limits.  Neurology: Gross sensation intact via light touch bilateral. Subjective burning to bottom of feet likely neuritis secondary to PVD.   Musculoskeletal: Mild tenderness with palpation at the keratotic lesion site on Right>Left, Muscular strength 5/5 in all groups without pain or limitation on range of motion. + atrophy plantar aspects of both feet and fat pad atrophy with prominent metatarsals noted.  Assessment and Plan: Problem List Items Addressed This Visit    None    Visit Diagnoses    Corns and callosities    -  Primary   Bilateral foot pain       Relevant Orders   DG Foot 2 Views Right (Completed)   DG Foot 2 Views Left (Completed)   Hammer toes of both feet       Neuritis         -Complete examination performed -Discussed treatment  options -Parred keratoic lesions using a chisel blade -Dispensed toe caps/spacers -Recommend capsician cream to feet for neuritis -Tea tree oil to nails  -Advised good supportive shoes and dispensed power steps inserts  -Patient to return to office as needed or sooner if condition worsens.  Landis Martins, DPM

## 2017-02-28 DIAGNOSIS — M180 Bilateral primary osteoarthritis of first carpometacarpal joints: Secondary | ICD-10-CM | POA: Diagnosis not present

## 2017-03-18 DIAGNOSIS — H524 Presbyopia: Secondary | ICD-10-CM | POA: Diagnosis not present

## 2017-04-14 ENCOUNTER — Telehealth: Payer: Self-pay | Admitting: Sports Medicine

## 2017-04-14 NOTE — Telephone Encounter (Signed)
Patients primary care office called(Beth) stating pt called them very upset that we billed her for something that she did not have and her insurance is stating she needed a referral but per [facility] the patient did not need a referral.Beth Sadie Haber) asked if a supervisor could call pt and see if we could get it fiqured out for pt. Also if you could let Beth know it was taken care of.

## 2017-05-01 DIAGNOSIS — I739 Peripheral vascular disease, unspecified: Secondary | ICD-10-CM | POA: Diagnosis not present

## 2017-05-01 DIAGNOSIS — K219 Gastro-esophageal reflux disease without esophagitis: Secondary | ICD-10-CM | POA: Diagnosis not present

## 2017-05-01 DIAGNOSIS — H409 Unspecified glaucoma: Secondary | ICD-10-CM | POA: Diagnosis not present

## 2017-05-01 DIAGNOSIS — M85852 Other specified disorders of bone density and structure, left thigh: Secondary | ICD-10-CM | POA: Diagnosis not present

## 2017-05-01 DIAGNOSIS — N183 Chronic kidney disease, stage 3 (moderate): Secondary | ICD-10-CM | POA: Diagnosis not present

## 2017-05-01 DIAGNOSIS — M79671 Pain in right foot: Secondary | ICD-10-CM | POA: Diagnosis not present

## 2017-05-01 DIAGNOSIS — R69 Illness, unspecified: Secondary | ICD-10-CM | POA: Diagnosis not present

## 2017-05-01 DIAGNOSIS — I10 Essential (primary) hypertension: Secondary | ICD-10-CM | POA: Diagnosis not present

## 2017-05-01 DIAGNOSIS — M545 Low back pain: Secondary | ICD-10-CM | POA: Diagnosis not present

## 2017-05-01 DIAGNOSIS — E782 Mixed hyperlipidemia: Secondary | ICD-10-CM | POA: Diagnosis not present

## 2017-05-09 DIAGNOSIS — M8589 Other specified disorders of bone density and structure, multiple sites: Secondary | ICD-10-CM | POA: Diagnosis not present

## 2017-05-31 DIAGNOSIS — R197 Diarrhea, unspecified: Secondary | ICD-10-CM | POA: Diagnosis not present

## 2017-06-06 DIAGNOSIS — Z23 Encounter for immunization: Secondary | ICD-10-CM | POA: Diagnosis not present

## 2017-06-06 DIAGNOSIS — M85852 Other specified disorders of bone density and structure, left thigh: Secondary | ICD-10-CM | POA: Diagnosis not present

## 2017-06-19 NOTE — Addendum Note (Signed)
Addendum  created 06/19/17 1131 by Roberts Gaudy, MD   Sign clinical note

## 2017-07-10 DIAGNOSIS — Z23 Encounter for immunization: Secondary | ICD-10-CM | POA: Diagnosis not present

## 2017-11-17 DIAGNOSIS — M85852 Other specified disorders of bone density and structure, left thigh: Secondary | ICD-10-CM | POA: Diagnosis not present

## 2017-11-17 DIAGNOSIS — E559 Vitamin D deficiency, unspecified: Secondary | ICD-10-CM | POA: Diagnosis not present

## 2017-11-17 DIAGNOSIS — E782 Mixed hyperlipidemia: Secondary | ICD-10-CM | POA: Diagnosis not present

## 2017-11-17 DIAGNOSIS — E781 Pure hyperglyceridemia: Secondary | ICD-10-CM | POA: Diagnosis not present

## 2017-11-17 DIAGNOSIS — I10 Essential (primary) hypertension: Secondary | ICD-10-CM | POA: Diagnosis not present

## 2017-11-21 DIAGNOSIS — N183 Chronic kidney disease, stage 3 (moderate): Secondary | ICD-10-CM | POA: Diagnosis not present

## 2017-11-21 DIAGNOSIS — E781 Pure hyperglyceridemia: Secondary | ICD-10-CM | POA: Diagnosis not present

## 2017-11-21 DIAGNOSIS — M545 Low back pain: Secondary | ICD-10-CM | POA: Diagnosis not present

## 2017-11-21 DIAGNOSIS — I10 Essential (primary) hypertension: Secondary | ICD-10-CM | POA: Diagnosis not present

## 2017-11-21 DIAGNOSIS — Z7189 Other specified counseling: Secondary | ICD-10-CM | POA: Diagnosis not present

## 2017-11-21 DIAGNOSIS — Z0000 Encounter for general adult medical examination without abnormal findings: Secondary | ICD-10-CM | POA: Diagnosis not present

## 2017-11-21 DIAGNOSIS — K219 Gastro-esophageal reflux disease without esophagitis: Secondary | ICD-10-CM | POA: Diagnosis not present

## 2017-11-21 DIAGNOSIS — M85852 Other specified disorders of bone density and structure, left thigh: Secondary | ICD-10-CM | POA: Diagnosis not present

## 2017-11-21 DIAGNOSIS — E782 Mixed hyperlipidemia: Secondary | ICD-10-CM | POA: Diagnosis not present

## 2017-11-21 DIAGNOSIS — K580 Irritable bowel syndrome with diarrhea: Secondary | ICD-10-CM | POA: Diagnosis not present

## 2017-11-28 DIAGNOSIS — J069 Acute upper respiratory infection, unspecified: Secondary | ICD-10-CM | POA: Diagnosis not present

## 2017-12-24 DIAGNOSIS — M180 Bilateral primary osteoarthritis of first carpometacarpal joints: Secondary | ICD-10-CM | POA: Diagnosis not present

## 2018-01-02 DIAGNOSIS — I10 Essential (primary) hypertension: Secondary | ICD-10-CM | POA: Diagnosis not present

## 2018-01-02 DIAGNOSIS — M85852 Other specified disorders of bone density and structure, left thigh: Secondary | ICD-10-CM | POA: Diagnosis not present

## 2018-01-02 DIAGNOSIS — K580 Irritable bowel syndrome with diarrhea: Secondary | ICD-10-CM | POA: Diagnosis not present

## 2018-01-02 DIAGNOSIS — Z7189 Other specified counseling: Secondary | ICD-10-CM | POA: Diagnosis not present

## 2018-01-02 DIAGNOSIS — N183 Chronic kidney disease, stage 3 (moderate): Secondary | ICD-10-CM | POA: Diagnosis not present

## 2018-01-02 DIAGNOSIS — K219 Gastro-esophageal reflux disease without esophagitis: Secondary | ICD-10-CM | POA: Diagnosis not present

## 2018-01-02 DIAGNOSIS — E782 Mixed hyperlipidemia: Secondary | ICD-10-CM | POA: Diagnosis not present

## 2018-01-02 DIAGNOSIS — Z0000 Encounter for general adult medical examination without abnormal findings: Secondary | ICD-10-CM | POA: Diagnosis not present

## 2018-01-02 DIAGNOSIS — M545 Low back pain: Secondary | ICD-10-CM | POA: Diagnosis not present

## 2018-01-02 DIAGNOSIS — E781 Pure hyperglyceridemia: Secondary | ICD-10-CM | POA: Diagnosis not present

## 2018-01-14 DIAGNOSIS — Z1231 Encounter for screening mammogram for malignant neoplasm of breast: Secondary | ICD-10-CM | POA: Diagnosis not present

## 2018-01-20 DIAGNOSIS — R922 Inconclusive mammogram: Secondary | ICD-10-CM | POA: Diagnosis not present

## 2018-01-20 DIAGNOSIS — R921 Mammographic calcification found on diagnostic imaging of breast: Secondary | ICD-10-CM | POA: Diagnosis not present

## 2018-01-20 DIAGNOSIS — N6489 Other specified disorders of breast: Secondary | ICD-10-CM | POA: Diagnosis not present

## 2018-03-30 DIAGNOSIS — H40033 Anatomical narrow angle, bilateral: Secondary | ICD-10-CM | POA: Diagnosis not present

## 2018-03-30 DIAGNOSIS — Z961 Presence of intraocular lens: Secondary | ICD-10-CM | POA: Diagnosis not present

## 2018-05-13 DIAGNOSIS — N183 Chronic kidney disease, stage 3 (moderate): Secondary | ICD-10-CM | POA: Diagnosis not present

## 2018-05-13 DIAGNOSIS — K580 Irritable bowel syndrome with diarrhea: Secondary | ICD-10-CM | POA: Diagnosis not present

## 2018-05-13 DIAGNOSIS — Z0000 Encounter for general adult medical examination without abnormal findings: Secondary | ICD-10-CM | POA: Diagnosis not present

## 2018-05-13 DIAGNOSIS — M85852 Other specified disorders of bone density and structure, left thigh: Secondary | ICD-10-CM | POA: Diagnosis not present

## 2018-05-13 DIAGNOSIS — E782 Mixed hyperlipidemia: Secondary | ICD-10-CM | POA: Diagnosis not present

## 2018-05-13 DIAGNOSIS — K219 Gastro-esophageal reflux disease without esophagitis: Secondary | ICD-10-CM | POA: Diagnosis not present

## 2018-05-13 DIAGNOSIS — E781 Pure hyperglyceridemia: Secondary | ICD-10-CM | POA: Diagnosis not present

## 2018-05-13 DIAGNOSIS — M545 Low back pain: Secondary | ICD-10-CM | POA: Diagnosis not present

## 2018-05-13 DIAGNOSIS — Z7189 Other specified counseling: Secondary | ICD-10-CM | POA: Diagnosis not present

## 2018-05-13 DIAGNOSIS — I10 Essential (primary) hypertension: Secondary | ICD-10-CM | POA: Diagnosis not present

## 2018-05-21 ENCOUNTER — Ambulatory Visit
Admission: RE | Admit: 2018-05-21 | Discharge: 2018-05-21 | Disposition: A | Discharge status: Home / Self Care | Payer: Medicare HMO | Source: Ambulatory Visit | Attending: Family Medicine | Admitting: Family Medicine

## 2018-05-21 ENCOUNTER — Other Ambulatory Visit: Payer: Self-pay | Admitting: Family Medicine

## 2018-05-21 DIAGNOSIS — I10 Essential (primary) hypertension: Secondary | ICD-10-CM | POA: Diagnosis not present

## 2018-05-21 DIAGNOSIS — Z79899 Other long term (current) drug therapy: Secondary | ICD-10-CM | POA: Diagnosis not present

## 2018-05-21 DIAGNOSIS — K580 Irritable bowel syndrome with diarrhea: Secondary | ICD-10-CM | POA: Diagnosis not present

## 2018-05-21 DIAGNOSIS — M545 Low back pain: Principal | ICD-10-CM

## 2018-05-21 DIAGNOSIS — M47816 Spondylosis without myelopathy or radiculopathy, lumbar region: Secondary | ICD-10-CM | POA: Diagnosis not present

## 2018-05-21 DIAGNOSIS — G8929 Other chronic pain: Secondary | ICD-10-CM

## 2018-05-21 DIAGNOSIS — N183 Chronic kidney disease, stage 3 (moderate): Secondary | ICD-10-CM | POA: Diagnosis not present

## 2018-05-21 DIAGNOSIS — E781 Pure hyperglyceridemia: Secondary | ICD-10-CM | POA: Diagnosis not present

## 2018-05-21 DIAGNOSIS — M85852 Other specified disorders of bone density and structure, left thigh: Secondary | ICD-10-CM | POA: Diagnosis not present

## 2018-05-21 DIAGNOSIS — I739 Peripheral vascular disease, unspecified: Secondary | ICD-10-CM | POA: Diagnosis not present

## 2018-05-21 DIAGNOSIS — K219 Gastro-esophageal reflux disease without esophagitis: Secondary | ICD-10-CM | POA: Diagnosis not present

## 2018-06-08 DIAGNOSIS — M4003 Postural kyphosis, cervicothoracic region: Secondary | ICD-10-CM | POA: Diagnosis not present

## 2018-06-08 DIAGNOSIS — M9901 Segmental and somatic dysfunction of cervical region: Secondary | ICD-10-CM | POA: Diagnosis not present

## 2018-06-08 DIAGNOSIS — M9903 Segmental and somatic dysfunction of lumbar region: Secondary | ICD-10-CM | POA: Diagnosis not present

## 2018-06-08 DIAGNOSIS — M4316 Spondylolisthesis, lumbar region: Secondary | ICD-10-CM | POA: Diagnosis not present

## 2018-06-09 DIAGNOSIS — M4003 Postural kyphosis, cervicothoracic region: Secondary | ICD-10-CM | POA: Diagnosis not present

## 2018-06-09 DIAGNOSIS — M9901 Segmental and somatic dysfunction of cervical region: Secondary | ICD-10-CM | POA: Diagnosis not present

## 2018-06-09 DIAGNOSIS — M9903 Segmental and somatic dysfunction of lumbar region: Secondary | ICD-10-CM | POA: Diagnosis not present

## 2018-06-09 DIAGNOSIS — M4316 Spondylolisthesis, lumbar region: Secondary | ICD-10-CM | POA: Diagnosis not present

## 2018-06-11 DIAGNOSIS — M9901 Segmental and somatic dysfunction of cervical region: Secondary | ICD-10-CM | POA: Diagnosis not present

## 2018-06-11 DIAGNOSIS — M4003 Postural kyphosis, cervicothoracic region: Secondary | ICD-10-CM | POA: Diagnosis not present

## 2018-06-11 DIAGNOSIS — M9903 Segmental and somatic dysfunction of lumbar region: Secondary | ICD-10-CM | POA: Diagnosis not present

## 2018-06-11 DIAGNOSIS — M4316 Spondylolisthesis, lumbar region: Secondary | ICD-10-CM | POA: Diagnosis not present

## 2018-06-15 DIAGNOSIS — M4003 Postural kyphosis, cervicothoracic region: Secondary | ICD-10-CM | POA: Diagnosis not present

## 2018-06-15 DIAGNOSIS — M9901 Segmental and somatic dysfunction of cervical region: Secondary | ICD-10-CM | POA: Diagnosis not present

## 2018-06-15 DIAGNOSIS — M9903 Segmental and somatic dysfunction of lumbar region: Secondary | ICD-10-CM | POA: Diagnosis not present

## 2018-06-15 DIAGNOSIS — M4316 Spondylolisthesis, lumbar region: Secondary | ICD-10-CM | POA: Diagnosis not present

## 2018-06-16 DIAGNOSIS — M9903 Segmental and somatic dysfunction of lumbar region: Secondary | ICD-10-CM | POA: Diagnosis not present

## 2018-06-16 DIAGNOSIS — M9901 Segmental and somatic dysfunction of cervical region: Secondary | ICD-10-CM | POA: Diagnosis not present

## 2018-06-16 DIAGNOSIS — M4003 Postural kyphosis, cervicothoracic region: Secondary | ICD-10-CM | POA: Diagnosis not present

## 2018-06-16 DIAGNOSIS — M4316 Spondylolisthesis, lumbar region: Secondary | ICD-10-CM | POA: Diagnosis not present

## 2018-06-18 DIAGNOSIS — M4003 Postural kyphosis, cervicothoracic region: Secondary | ICD-10-CM | POA: Diagnosis not present

## 2018-06-18 DIAGNOSIS — M4316 Spondylolisthesis, lumbar region: Secondary | ICD-10-CM | POA: Diagnosis not present

## 2018-06-18 DIAGNOSIS — M9903 Segmental and somatic dysfunction of lumbar region: Secondary | ICD-10-CM | POA: Diagnosis not present

## 2018-06-18 DIAGNOSIS — M9901 Segmental and somatic dysfunction of cervical region: Secondary | ICD-10-CM | POA: Diagnosis not present

## 2018-06-22 DIAGNOSIS — M4316 Spondylolisthesis, lumbar region: Secondary | ICD-10-CM | POA: Diagnosis not present

## 2018-06-22 DIAGNOSIS — M9901 Segmental and somatic dysfunction of cervical region: Secondary | ICD-10-CM | POA: Diagnosis not present

## 2018-06-22 DIAGNOSIS — M4003 Postural kyphosis, cervicothoracic region: Secondary | ICD-10-CM | POA: Diagnosis not present

## 2018-06-22 DIAGNOSIS — M9903 Segmental and somatic dysfunction of lumbar region: Secondary | ICD-10-CM | POA: Diagnosis not present

## 2018-06-23 DIAGNOSIS — M9903 Segmental and somatic dysfunction of lumbar region: Secondary | ICD-10-CM | POA: Diagnosis not present

## 2018-06-23 DIAGNOSIS — M4003 Postural kyphosis, cervicothoracic region: Secondary | ICD-10-CM | POA: Diagnosis not present

## 2018-06-23 DIAGNOSIS — M9901 Segmental and somatic dysfunction of cervical region: Secondary | ICD-10-CM | POA: Diagnosis not present

## 2018-06-23 DIAGNOSIS — M4316 Spondylolisthesis, lumbar region: Secondary | ICD-10-CM | POA: Diagnosis not present

## 2018-06-25 DIAGNOSIS — M9901 Segmental and somatic dysfunction of cervical region: Secondary | ICD-10-CM | POA: Diagnosis not present

## 2018-06-25 DIAGNOSIS — M4316 Spondylolisthesis, lumbar region: Secondary | ICD-10-CM | POA: Diagnosis not present

## 2018-06-25 DIAGNOSIS — M9903 Segmental and somatic dysfunction of lumbar region: Secondary | ICD-10-CM | POA: Diagnosis not present

## 2018-06-25 DIAGNOSIS — M4003 Postural kyphosis, cervicothoracic region: Secondary | ICD-10-CM | POA: Diagnosis not present

## 2018-06-30 DIAGNOSIS — M4003 Postural kyphosis, cervicothoracic region: Secondary | ICD-10-CM | POA: Diagnosis not present

## 2018-06-30 DIAGNOSIS — M9901 Segmental and somatic dysfunction of cervical region: Secondary | ICD-10-CM | POA: Diagnosis not present

## 2018-06-30 DIAGNOSIS — M9903 Segmental and somatic dysfunction of lumbar region: Secondary | ICD-10-CM | POA: Diagnosis not present

## 2018-06-30 DIAGNOSIS — M4316 Spondylolisthesis, lumbar region: Secondary | ICD-10-CM | POA: Diagnosis not present

## 2018-07-02 DIAGNOSIS — M4316 Spondylolisthesis, lumbar region: Secondary | ICD-10-CM | POA: Diagnosis not present

## 2018-07-02 DIAGNOSIS — M9901 Segmental and somatic dysfunction of cervical region: Secondary | ICD-10-CM | POA: Diagnosis not present

## 2018-07-02 DIAGNOSIS — M4003 Postural kyphosis, cervicothoracic region: Secondary | ICD-10-CM | POA: Diagnosis not present

## 2018-07-02 DIAGNOSIS — M9903 Segmental and somatic dysfunction of lumbar region: Secondary | ICD-10-CM | POA: Diagnosis not present

## 2018-07-06 DIAGNOSIS — M4003 Postural kyphosis, cervicothoracic region: Secondary | ICD-10-CM | POA: Diagnosis not present

## 2018-07-06 DIAGNOSIS — M4316 Spondylolisthesis, lumbar region: Secondary | ICD-10-CM | POA: Diagnosis not present

## 2018-07-06 DIAGNOSIS — M9901 Segmental and somatic dysfunction of cervical region: Secondary | ICD-10-CM | POA: Diagnosis not present

## 2018-07-06 DIAGNOSIS — M9903 Segmental and somatic dysfunction of lumbar region: Secondary | ICD-10-CM | POA: Diagnosis not present

## 2018-07-09 DIAGNOSIS — M4003 Postural kyphosis, cervicothoracic region: Secondary | ICD-10-CM | POA: Diagnosis not present

## 2018-07-09 DIAGNOSIS — M9903 Segmental and somatic dysfunction of lumbar region: Secondary | ICD-10-CM | POA: Diagnosis not present

## 2018-07-09 DIAGNOSIS — M4316 Spondylolisthesis, lumbar region: Secondary | ICD-10-CM | POA: Diagnosis not present

## 2018-07-09 DIAGNOSIS — M9901 Segmental and somatic dysfunction of cervical region: Secondary | ICD-10-CM | POA: Diagnosis not present

## 2018-07-13 DIAGNOSIS — M9903 Segmental and somatic dysfunction of lumbar region: Secondary | ICD-10-CM | POA: Diagnosis not present

## 2018-07-13 DIAGNOSIS — M4316 Spondylolisthesis, lumbar region: Secondary | ICD-10-CM | POA: Diagnosis not present

## 2018-07-13 DIAGNOSIS — M9901 Segmental and somatic dysfunction of cervical region: Secondary | ICD-10-CM | POA: Diagnosis not present

## 2018-07-13 DIAGNOSIS — M4003 Postural kyphosis, cervicothoracic region: Secondary | ICD-10-CM | POA: Diagnosis not present

## 2018-07-16 DIAGNOSIS — M4003 Postural kyphosis, cervicothoracic region: Secondary | ICD-10-CM | POA: Diagnosis not present

## 2018-07-16 DIAGNOSIS — M9903 Segmental and somatic dysfunction of lumbar region: Secondary | ICD-10-CM | POA: Diagnosis not present

## 2018-07-16 DIAGNOSIS — M4316 Spondylolisthesis, lumbar region: Secondary | ICD-10-CM | POA: Diagnosis not present

## 2018-07-16 DIAGNOSIS — M9901 Segmental and somatic dysfunction of cervical region: Secondary | ICD-10-CM | POA: Diagnosis not present

## 2018-07-23 DIAGNOSIS — M4003 Postural kyphosis, cervicothoracic region: Secondary | ICD-10-CM | POA: Diagnosis not present

## 2018-07-23 DIAGNOSIS — M9901 Segmental and somatic dysfunction of cervical region: Secondary | ICD-10-CM | POA: Diagnosis not present

## 2018-07-23 DIAGNOSIS — M4316 Spondylolisthesis, lumbar region: Secondary | ICD-10-CM | POA: Diagnosis not present

## 2018-07-23 DIAGNOSIS — M9903 Segmental and somatic dysfunction of lumbar region: Secondary | ICD-10-CM | POA: Diagnosis not present

## 2018-07-29 DIAGNOSIS — Z23 Encounter for immunization: Secondary | ICD-10-CM | POA: Diagnosis not present

## 2018-07-30 DIAGNOSIS — R921 Mammographic calcification found on diagnostic imaging of breast: Secondary | ICD-10-CM | POA: Diagnosis not present

## 2018-07-30 DIAGNOSIS — M9903 Segmental and somatic dysfunction of lumbar region: Secondary | ICD-10-CM | POA: Diagnosis not present

## 2018-07-30 DIAGNOSIS — M4003 Postural kyphosis, cervicothoracic region: Secondary | ICD-10-CM | POA: Diagnosis not present

## 2018-07-30 DIAGNOSIS — M9901 Segmental and somatic dysfunction of cervical region: Secondary | ICD-10-CM | POA: Diagnosis not present

## 2018-07-30 DIAGNOSIS — M4316 Spondylolisthesis, lumbar region: Secondary | ICD-10-CM | POA: Diagnosis not present

## 2018-08-06 DIAGNOSIS — M4003 Postural kyphosis, cervicothoracic region: Secondary | ICD-10-CM | POA: Diagnosis not present

## 2018-08-06 DIAGNOSIS — M9903 Segmental and somatic dysfunction of lumbar region: Secondary | ICD-10-CM | POA: Diagnosis not present

## 2018-08-06 DIAGNOSIS — M9901 Segmental and somatic dysfunction of cervical region: Secondary | ICD-10-CM | POA: Diagnosis not present

## 2018-08-06 DIAGNOSIS — M4316 Spondylolisthesis, lumbar region: Secondary | ICD-10-CM | POA: Diagnosis not present

## 2018-12-11 DIAGNOSIS — I739 Peripheral vascular disease, unspecified: Secondary | ICD-10-CM | POA: Diagnosis not present

## 2018-12-11 DIAGNOSIS — E781 Pure hyperglyceridemia: Secondary | ICD-10-CM | POA: Diagnosis not present

## 2018-12-11 DIAGNOSIS — K219 Gastro-esophageal reflux disease without esophagitis: Secondary | ICD-10-CM | POA: Diagnosis not present

## 2018-12-11 DIAGNOSIS — K580 Irritable bowel syndrome with diarrhea: Secondary | ICD-10-CM | POA: Diagnosis not present

## 2018-12-11 DIAGNOSIS — I10 Essential (primary) hypertension: Secondary | ICD-10-CM | POA: Diagnosis not present

## 2018-12-11 DIAGNOSIS — M85852 Other specified disorders of bone density and structure, left thigh: Secondary | ICD-10-CM | POA: Diagnosis not present

## 2018-12-11 DIAGNOSIS — Z79899 Other long term (current) drug therapy: Secondary | ICD-10-CM | POA: Diagnosis not present

## 2018-12-11 DIAGNOSIS — M545 Low back pain: Secondary | ICD-10-CM | POA: Diagnosis not present

## 2018-12-11 DIAGNOSIS — G8929 Other chronic pain: Secondary | ICD-10-CM | POA: Diagnosis not present

## 2018-12-11 DIAGNOSIS — N183 Chronic kidney disease, stage 3 (moderate): Secondary | ICD-10-CM | POA: Diagnosis not present

## 2018-12-17 DIAGNOSIS — K219 Gastro-esophageal reflux disease without esophagitis: Secondary | ICD-10-CM | POA: Diagnosis not present

## 2018-12-17 DIAGNOSIS — N183 Chronic kidney disease, stage 3 (moderate): Secondary | ICD-10-CM | POA: Diagnosis not present

## 2018-12-17 DIAGNOSIS — E538 Deficiency of other specified B group vitamins: Secondary | ICD-10-CM | POA: Diagnosis not present

## 2018-12-17 DIAGNOSIS — M85852 Other specified disorders of bone density and structure, left thigh: Secondary | ICD-10-CM | POA: Diagnosis not present

## 2018-12-17 DIAGNOSIS — Z0000 Encounter for general adult medical examination without abnormal findings: Secondary | ICD-10-CM | POA: Diagnosis not present

## 2018-12-17 DIAGNOSIS — Z1389 Encounter for screening for other disorder: Secondary | ICD-10-CM | POA: Diagnosis not present

## 2018-12-17 DIAGNOSIS — E782 Mixed hyperlipidemia: Secondary | ICD-10-CM | POA: Diagnosis not present

## 2018-12-17 DIAGNOSIS — R69 Illness, unspecified: Secondary | ICD-10-CM | POA: Diagnosis not present

## 2018-12-17 DIAGNOSIS — M545 Low back pain: Secondary | ICD-10-CM | POA: Diagnosis not present

## 2018-12-17 DIAGNOSIS — I10 Essential (primary) hypertension: Secondary | ICD-10-CM | POA: Diagnosis not present

## 2019-03-11 DIAGNOSIS — R69 Illness, unspecified: Secondary | ICD-10-CM | POA: Diagnosis not present

## 2019-03-11 DIAGNOSIS — R002 Palpitations: Secondary | ICD-10-CM | POA: Diagnosis not present

## 2019-03-11 DIAGNOSIS — I10 Essential (primary) hypertension: Secondary | ICD-10-CM | POA: Diagnosis not present

## 2019-03-25 DIAGNOSIS — I10 Essential (primary) hypertension: Secondary | ICD-10-CM | POA: Diagnosis not present

## 2019-03-25 DIAGNOSIS — R69 Illness, unspecified: Secondary | ICD-10-CM | POA: Diagnosis not present

## 2019-03-26 DIAGNOSIS — I10 Essential (primary) hypertension: Secondary | ICD-10-CM | POA: Diagnosis not present

## 2019-03-26 DIAGNOSIS — E781 Pure hyperglyceridemia: Secondary | ICD-10-CM | POA: Diagnosis not present

## 2019-03-26 DIAGNOSIS — H409 Unspecified glaucoma: Secondary | ICD-10-CM | POA: Diagnosis not present

## 2019-03-26 DIAGNOSIS — E782 Mixed hyperlipidemia: Secondary | ICD-10-CM | POA: Diagnosis not present

## 2019-03-26 DIAGNOSIS — N183 Chronic kidney disease, stage 3 (moderate): Secondary | ICD-10-CM | POA: Diagnosis not present

## 2019-04-03 DIAGNOSIS — Z1231 Encounter for screening mammogram for malignant neoplasm of breast: Secondary | ICD-10-CM | POA: Diagnosis not present

## 2019-04-22 DIAGNOSIS — E781 Pure hyperglyceridemia: Secondary | ICD-10-CM | POA: Diagnosis not present

## 2019-04-22 DIAGNOSIS — N183 Chronic kidney disease, stage 3 (moderate): Secondary | ICD-10-CM | POA: Diagnosis not present

## 2019-04-22 DIAGNOSIS — R69 Illness, unspecified: Secondary | ICD-10-CM | POA: Diagnosis not present

## 2019-04-22 DIAGNOSIS — H409 Unspecified glaucoma: Secondary | ICD-10-CM | POA: Diagnosis not present

## 2019-04-22 DIAGNOSIS — I10 Essential (primary) hypertension: Secondary | ICD-10-CM | POA: Diagnosis not present

## 2019-04-22 DIAGNOSIS — E782 Mixed hyperlipidemia: Secondary | ICD-10-CM | POA: Diagnosis not present

## 2019-05-03 DIAGNOSIS — L820 Inflamed seborrheic keratosis: Secondary | ICD-10-CM | POA: Diagnosis not present

## 2019-05-03 DIAGNOSIS — D225 Melanocytic nevi of trunk: Secondary | ICD-10-CM | POA: Diagnosis not present

## 2019-05-03 DIAGNOSIS — L821 Other seborrheic keratosis: Secondary | ICD-10-CM | POA: Diagnosis not present

## 2019-05-03 DIAGNOSIS — L565 Disseminated superficial actinic porokeratosis (DSAP): Secondary | ICD-10-CM | POA: Diagnosis not present

## 2019-05-31 DIAGNOSIS — E781 Pure hyperglyceridemia: Secondary | ICD-10-CM | POA: Diagnosis not present

## 2019-05-31 DIAGNOSIS — H409 Unspecified glaucoma: Secondary | ICD-10-CM | POA: Diagnosis not present

## 2019-05-31 DIAGNOSIS — I10 Essential (primary) hypertension: Secondary | ICD-10-CM | POA: Diagnosis not present

## 2019-05-31 DIAGNOSIS — N183 Chronic kidney disease, stage 3 (moderate): Secondary | ICD-10-CM | POA: Diagnosis not present

## 2019-05-31 DIAGNOSIS — E782 Mixed hyperlipidemia: Secondary | ICD-10-CM | POA: Diagnosis not present

## 2019-06-14 DIAGNOSIS — R69 Illness, unspecified: Secondary | ICD-10-CM | POA: Diagnosis not present

## 2019-06-14 DIAGNOSIS — M545 Low back pain: Secondary | ICD-10-CM | POA: Diagnosis not present

## 2019-06-14 DIAGNOSIS — M85852 Other specified disorders of bone density and structure, left thigh: Secondary | ICD-10-CM | POA: Diagnosis not present

## 2019-06-14 DIAGNOSIS — E782 Mixed hyperlipidemia: Secondary | ICD-10-CM | POA: Diagnosis not present

## 2019-06-14 DIAGNOSIS — E538 Deficiency of other specified B group vitamins: Secondary | ICD-10-CM | POA: Diagnosis not present

## 2019-06-14 DIAGNOSIS — N183 Chronic kidney disease, stage 3 (moderate): Secondary | ICD-10-CM | POA: Diagnosis not present

## 2019-06-14 DIAGNOSIS — I10 Essential (primary) hypertension: Secondary | ICD-10-CM | POA: Diagnosis not present

## 2019-06-14 DIAGNOSIS — K219 Gastro-esophageal reflux disease without esophagitis: Secondary | ICD-10-CM | POA: Diagnosis not present

## 2019-06-14 DIAGNOSIS — H409 Unspecified glaucoma: Secondary | ICD-10-CM | POA: Diagnosis not present

## 2019-06-18 DIAGNOSIS — R69 Illness, unspecified: Secondary | ICD-10-CM | POA: Diagnosis not present

## 2019-06-18 DIAGNOSIS — M85852 Other specified disorders of bone density and structure, left thigh: Secondary | ICD-10-CM | POA: Diagnosis not present

## 2019-06-18 DIAGNOSIS — H409 Unspecified glaucoma: Secondary | ICD-10-CM | POA: Diagnosis not present

## 2019-06-18 DIAGNOSIS — M545 Low back pain: Secondary | ICD-10-CM | POA: Diagnosis not present

## 2019-06-18 DIAGNOSIS — N183 Chronic kidney disease, stage 3 (moderate): Secondary | ICD-10-CM | POA: Diagnosis not present

## 2019-06-18 DIAGNOSIS — E782 Mixed hyperlipidemia: Secondary | ICD-10-CM | POA: Diagnosis not present

## 2019-06-18 DIAGNOSIS — I10 Essential (primary) hypertension: Secondary | ICD-10-CM | POA: Diagnosis not present

## 2019-06-18 DIAGNOSIS — G8929 Other chronic pain: Secondary | ICD-10-CM | POA: Diagnosis not present

## 2019-06-18 DIAGNOSIS — K219 Gastro-esophageal reflux disease without esophagitis: Secondary | ICD-10-CM | POA: Diagnosis not present

## 2019-06-29 DIAGNOSIS — H524 Presbyopia: Secondary | ICD-10-CM | POA: Diagnosis not present

## 2019-07-20 DIAGNOSIS — Z23 Encounter for immunization: Secondary | ICD-10-CM | POA: Diagnosis not present

## 2019-08-02 DIAGNOSIS — M8589 Other specified disorders of bone density and structure, multiple sites: Secondary | ICD-10-CM | POA: Diagnosis not present

## 2019-08-02 DIAGNOSIS — R296 Repeated falls: Secondary | ICD-10-CM | POA: Diagnosis not present

## 2019-08-02 DIAGNOSIS — Z8262 Family history of osteoporosis: Secondary | ICD-10-CM | POA: Diagnosis not present

## 2019-08-02 DIAGNOSIS — R29890 Loss of height: Secondary | ICD-10-CM | POA: Diagnosis not present

## 2019-08-02 DIAGNOSIS — Z90710 Acquired absence of both cervix and uterus: Secondary | ICD-10-CM | POA: Diagnosis not present

## 2019-10-27 DIAGNOSIS — Z20828 Contact with and (suspected) exposure to other viral communicable diseases: Secondary | ICD-10-CM | POA: Diagnosis not present

## 2019-11-03 DIAGNOSIS — Z20828 Contact with and (suspected) exposure to other viral communicable diseases: Secondary | ICD-10-CM | POA: Diagnosis not present

## 2019-11-03 DIAGNOSIS — I10 Essential (primary) hypertension: Secondary | ICD-10-CM | POA: Diagnosis not present

## 2019-11-03 DIAGNOSIS — U071 COVID-19: Secondary | ICD-10-CM | POA: Diagnosis not present

## 2019-11-03 DIAGNOSIS — Z1152 Encounter for screening for COVID-19: Secondary | ICD-10-CM | POA: Diagnosis not present

## 2019-11-04 ENCOUNTER — Other Ambulatory Visit: Payer: Self-pay | Admitting: Nurse Practitioner

## 2019-11-04 DIAGNOSIS — U071 COVID-19: Secondary | ICD-10-CM

## 2019-11-04 NOTE — Progress Notes (Signed)
  I connected by phone with Kaitlyn Newton on 11/04/2019 at 2:17 PM to discuss the potential use of an new treatment for mild to moderate COVID-19 viral infection in non-hospitalized patients.  This patient is a 79 y.o. female that meets the FDA criteria for Emergency Use Authorization of bamlanivimab or casirivimab\imdevimab.  Has a (+) direct SARS-CoV-2 viral test result  Has mild or moderate COVID-19   Is ? 80 years of age and weighs ? 40 kg  Is NOT hospitalized due to COVID-19  Is NOT requiring oxygen therapy or requiring an increase in baseline oxygen flow rate due to COVID-19  Is within 10 days of symptom onset  Has at least one of the high risk factor(s) for progression to severe COVID-19 and/or hospitalization as defined in EUA.  Specific high risk criteria : >/= 80 yo   I have spoken and communicated the following to the patient or parent/caregiver:  1. FDA has authorized the emergency use of bamlanivimab and casirivimab\imdevimab for the treatment of mild to moderate COVID-19 in adults and pediatric patients with positive results of direct SARS-CoV-2 viral testing who are 9 years of age and older weighing at least 40 kg, and who are at high risk for progressing to severe COVID-19 and/or hospitalization.  2. The significant known and potential risks and benefits of bamlanivimab and casirivimab\imdevimab, and the extent to which such potential risks and benefits are unknown.  3. Information on available alternative treatments and the risks and benefits of those alternatives, including clinical trials.  4. Patients treated with bamlanivimab and casirivimab\imdevimab should continue to self-isolate and use infection control measures (e.g., wear mask, isolate, social distance, avoid sharing personal items, clean and disinfect "high touch" surfaces, and frequent handwashing) according to CDC guidelines.   5. The patient or parent/caregiver has the option to accept or  refuse bamlanivimab or casirivimab\imdevimab .  After reviewing this information with the patient, The patient agreed to proceed with receiving the bamlanimivab infusion and will be provided a copy of the Fact sheet prior to receiving the infusion.Fenton Foy 11/04/2019 2:17 PM

## 2019-11-06 DIAGNOSIS — R197 Diarrhea, unspecified: Secondary | ICD-10-CM | POA: Diagnosis not present

## 2019-11-06 DIAGNOSIS — U071 COVID-19: Secondary | ICD-10-CM | POA: Diagnosis not present

## 2019-11-06 DIAGNOSIS — Z9189 Other specified personal risk factors, not elsewhere classified: Secondary | ICD-10-CM | POA: Diagnosis not present

## 2019-11-08 ENCOUNTER — Ambulatory Visit (HOSPITAL_COMMUNITY)
Admission: RE | Admit: 2019-11-08 | Discharge: 2019-11-08 | Disposition: A | Discharge status: Home / Self Care | Payer: Medicare Other | Source: Ambulatory Visit | Attending: Pulmonary Disease | Admitting: Pulmonary Disease

## 2019-11-08 DIAGNOSIS — Z23 Encounter for immunization: Secondary | ICD-10-CM | POA: Diagnosis not present

## 2019-11-08 DIAGNOSIS — U071 COVID-19: Secondary | ICD-10-CM

## 2019-11-08 MED ORDER — ALBUTEROL SULFATE HFA 108 (90 BASE) MCG/ACT IN AERS: 2.0000 | INHALATION_SPRAY | Freq: Once | RESPIRATORY_TRACT | Status: DC | PRN

## 2019-11-08 MED ORDER — SODIUM CHLORIDE 0.9 % IV SOLN
700.0000 mg | Freq: Once | INTRAVENOUS | Status: AC
  Administered 2019-11-08: 700 mg via INTRAVENOUS
  Filled 2019-11-08: qty 20

## 2019-11-08 MED ORDER — DIPHENHYDRAMINE HCL 50 MG/ML IJ SOLN: 50.0000 mg | Freq: Once | INTRAMUSCULAR | Status: DC | PRN

## 2019-11-08 MED ORDER — METHYLPREDNISOLONE SODIUM SUCC 125 MG IJ SOLR: 125.0000 mg | Freq: Once | INTRAMUSCULAR | Status: DC | PRN

## 2019-11-08 MED ORDER — SODIUM CHLORIDE 0.9 % IV SOLN
INTRAVENOUS | Status: DC | PRN
  Administered 2019-11-08: 250 mL via INTRAVENOUS

## 2019-11-08 MED ORDER — FAMOTIDINE IN NACL 20-0.9 MG/50ML-% IV SOLN: 20.0000 mg | Freq: Once | INTRAVENOUS | Status: DC | PRN

## 2019-11-08 MED ORDER — EPINEPHRINE 0.3 MG/0.3ML IJ SOAJ: 0.3000 mg | Freq: Once | INTRAMUSCULAR | Status: DC | PRN

## 2019-11-08 NOTE — Discharge Instructions (Signed)

## 2019-11-08 NOTE — Progress Notes (Signed)
  Diagnosis: COVID-19  Physician: Dr. Wright  Procedure: Cov[facility] Infusion Clinic Med: bamlanivimab infusion - Provided patient with bamlanimivab fact sheet for patients, parents and caregivers prior to infusion.  Complications: No immediate complications noted.  Discharge: Discharged home   Kaitlyn Newton 11/08/2019   

## 2019-11-10 ENCOUNTER — Emergency Department (HOSPITAL_COMMUNITY)
Admission: EM | Admit: 2019-11-10 | Discharge: 2019-11-10 | Disposition: A | Discharge status: Home / Self Care | Payer: Medicare HMO | Attending: Emergency Medicine | Admitting: Emergency Medicine

## 2019-11-10 ENCOUNTER — Other Ambulatory Visit: Payer: Self-pay

## 2019-11-10 ENCOUNTER — Emergency Department (HOSPITAL_COMMUNITY): Payer: Medicare HMO

## 2019-11-10 DIAGNOSIS — I959 Hypotension, unspecified: Secondary | ICD-10-CM | POA: Diagnosis not present

## 2019-11-10 DIAGNOSIS — K449 Diaphragmatic hernia without obstruction or gangrene: Secondary | ICD-10-CM | POA: Diagnosis not present

## 2019-11-10 DIAGNOSIS — E876 Hypokalemia: Secondary | ICD-10-CM

## 2019-11-10 DIAGNOSIS — R5381 Other malaise: Secondary | ICD-10-CM | POA: Diagnosis not present

## 2019-11-10 DIAGNOSIS — U071 COVID-19: Secondary | ICD-10-CM

## 2019-11-10 DIAGNOSIS — R197 Diarrhea, unspecified: Secondary | ICD-10-CM | POA: Diagnosis not present

## 2019-11-10 DIAGNOSIS — R0902 Hypoxemia: Secondary | ICD-10-CM | POA: Diagnosis not present

## 2019-11-10 DIAGNOSIS — I129 Hypertensive chronic kidney disease with stage 1 through stage 4 chronic kidney disease, or unspecified chronic kidney disease: Secondary | ICD-10-CM | POA: Insufficient documentation

## 2019-11-10 DIAGNOSIS — Z7982 Long term (current) use of aspirin: Secondary | ICD-10-CM | POA: Insufficient documentation

## 2019-11-10 DIAGNOSIS — Z79899 Other long term (current) drug therapy: Secondary | ICD-10-CM | POA: Diagnosis not present

## 2019-11-10 DIAGNOSIS — N183 Chronic kidney disease, stage 3 unspecified: Secondary | ICD-10-CM | POA: Diagnosis not present

## 2019-11-10 DIAGNOSIS — E860 Dehydration: Secondary | ICD-10-CM | POA: Diagnosis not present

## 2019-11-10 LAB — CBC WITH DIFFERENTIAL/PLATELET
Abs Immature Granulocytes: 0.1 10*3/uL — ABNORMAL HIGH (ref 0.00–0.07)
Basophils Absolute: 0 10*3/uL (ref 0.0–0.1)
Basophils Relative: 0 %
Eosinophils Absolute: 0 10*3/uL (ref 0.0–0.5)
Eosinophils Relative: 0 %
HCT: 37.8 % (ref 36.0–46.0)
Hemoglobin: 13.6 g/dL (ref 12.0–15.0)
Immature Granulocytes: 1 %
Lymphocytes Relative: 7 %
Lymphs Abs: 0.6 10*3/uL — ABNORMAL LOW (ref 0.7–4.0)
MCH: 32.6 pg (ref 26.0–34.0)
MCHC: 36 g/dL (ref 30.0–36.0)
MCV: 90.6 fL (ref 80.0–100.0)
Monocytes Absolute: 0.4 10*3/uL (ref 0.1–1.0)
Monocytes Relative: 4 %
Neutro Abs: 8.4 10*3/uL — ABNORMAL HIGH (ref 1.7–7.7)
Neutrophils Relative %: 88 %
Platelets: 434 10*3/uL — ABNORMAL HIGH (ref 150–400)
RBC: 4.17 MIL/uL (ref 3.87–5.11)
RDW: 12.5 % (ref 11.5–15.5)
WBC: 9.5 10*3/uL (ref 4.0–10.5)
nRBC: 0 % (ref 0.0–0.2)

## 2019-11-10 LAB — URINALYSIS, ROUTINE W REFLEX MICROSCOPIC
Bacteria, UA: NONE SEEN
Bilirubin Urine: NEGATIVE
Glucose, UA: NEGATIVE mg/dL
Hgb urine dipstick: NEGATIVE
Ketones, ur: NEGATIVE mg/dL
Nitrite: NEGATIVE
Protein, ur: NEGATIVE mg/dL
Specific Gravity, Urine: 1.013 (ref 1.005–1.030)
pH: 6 (ref 5.0–8.0)

## 2019-11-10 LAB — COMPREHENSIVE METABOLIC PANEL
ALT: 22 U/L (ref 0–44)
AST: 33 U/L (ref 15–41)
Albumin: 3.6 g/dL (ref 3.5–5.0)
Alkaline Phosphatase: 34 U/L — ABNORMAL LOW (ref 38–126)
Anion gap: 15 (ref 5–15)
BUN: 37 mg/dL — ABNORMAL HIGH (ref 8–23)
CO2: 20 mmol/L — ABNORMAL LOW (ref 22–32)
Calcium: 9.6 mg/dL (ref 8.9–10.3)
Chloride: 95 mmol/L — ABNORMAL LOW (ref 98–111)
Creatinine, Ser: 1.48 mg/dL — ABNORMAL HIGH (ref 0.44–1.00)
GFR calc Af Amer: 39 mL/min — ABNORMAL LOW (ref >60)
GFR calc non Af Amer: 33 mL/min — ABNORMAL LOW (ref >60)
Glucose, Bld: 105 mg/dL — ABNORMAL HIGH (ref 70–99)
Potassium: 3.2 mmol/L — ABNORMAL LOW (ref 3.5–5.1)
Sodium: 130 mmol/L — ABNORMAL LOW (ref 135–145)
Total Bilirubin: 0.7 mg/dL (ref 0.3–1.2)
Total Protein: 7.9 g/dL (ref 6.5–8.1)

## 2019-11-10 MED ORDER — ONDANSETRON 4 MG PO TBDP: ORAL_TABLET | ORAL | 0 refills | Status: DC

## 2019-11-10 MED ORDER — ONDANSETRON HCL 4 MG/2ML IJ SOLN
4.0000 mg | Freq: Once | INTRAMUSCULAR | Status: AC
  Administered 2019-11-10: 4 mg via INTRAVENOUS
  Filled 2019-11-10: qty 2

## 2019-11-10 MED ORDER — SODIUM CHLORIDE 0.9 % IV BOLUS
1000.0000 mL | Freq: Once | INTRAVENOUS | Status: AC
  Administered 2019-11-10: 1000 mL via INTRAVENOUS

## 2019-11-10 MED ORDER — LACTATED RINGERS IV BOLUS
1000.0000 mL | Freq: Once | INTRAVENOUS | Status: AC
  Administered 2019-11-10: 1000 mL via INTRAVENOUS

## 2019-11-10 MED ORDER — IOHEXOL 300 MG/ML  SOLN
75.0000 mL | Freq: Once | INTRAMUSCULAR | Status: AC | PRN
  Administered 2019-11-10: 75 mL via INTRAVENOUS

## 2019-11-10 MED ORDER — POTASSIUM CHLORIDE 10 MEQ/100ML IV SOLN
10.0000 meq | Freq: Once | INTRAVENOUS | Status: AC
  Administered 2019-11-10: 10 meq via INTRAVENOUS
  Filled 2019-11-10: qty 100

## 2019-11-10 NOTE — ED Provider Notes (Signed)
  Physical Exam  BP (!) 134/59 (BP Location: Left Arm)   Pulse 73   Temp 97.7 F (36.5 C) (Oral)   Resp (!) 21   SpO2 93%   Physical Exam  ED Course/Procedures     Procedures  MDM  Patient here assumed at 4 PM.  Patient was recently positive for Covid .  Patient finished antibiotic infusion 2 days ago for Covid.  Patient has been having diarrhea and poor appetite.  Labs showed creatinine 1.5, sodium 130. Patient was thought to be mildly dehydrated .  Signout pending IV fluids and reassessment.  6:03 PM K 3.2, supplemented. Given 2 L LR in the ED. Tolerated PO fluids. CT unremarkable. UA showed no UTI.  I told patient that she can add the admitted for observation for hydration over be sent home with zofran. Patient wanted to go home. Gave strict return precautions       Drenda Freeze, MD 11/10/19 [id number]

## 2019-11-10 NOTE — ED Provider Notes (Signed)
[facility] DEPT Provider Note   CSN: [id number] Arrival date & time: 11/10/19  1252     History Chief Complaint  Patient presents with  . COVID+  . Diarrhea    Kaitlyn Newton is a 79 y.o. female.  HPI Patient presents with diarrhea likely due to Covid.  Has had diarrhea for the last 8 days.  Decreased oral intake.  States she just does not have an appetite.  No nausea or vomiting however.  No real abdominal pain.  States she feels weak all over.  Had antibody infusion with the last dose of 2 days ago.  No abdominal pain.    Past Medical History:  Diagnosis Date  . Anxiety   . Arthritis    hands  . CKD (chronic kidney disease), stage III    no nephrologist  . GERD (gastroesophageal reflux disease)   . Heart murmur    no cardiologist  . High cholesterol   . HTN (hypertension)    states under control with meds., has been on med. since 1990s  . Mucoid cyst of joint 12/2016   left long finger  . PONV (postoperative nausea and vomiting)    after appendectomy with ether    There are no problems to display for this patient.   Past Surgical History:  Procedure Laterality Date  . APPENDECTOMY    . BILATERAL SALPINGOOPHORECTOMY  03/07/2003  . BLADDER REPAIR  03/07/2003   anterior repair  . BLADDER SUSPENSION    . BLADDER SUSPENSION  03/07/2003   SPARC sling  . CATARACT EXTRACTION W/ INTRAOCULAR LENS  IMPLANT, BILATERAL Bilateral   . DUPUYTREN / PALMAR FASCIOTOMY Right 03/23/2009  . ESOPHAGOGASTRODUODENOSCOPY    . I & D EXTREMITY N/A 01/02/2017   Procedure: DEBRIDEMENT DIP JOINT;  Surgeon: Leanora Cover, MD;  Location: [facility];  Service: Orthopedics;  Laterality: N/A;  . MASS EXCISION Left 01/02/2017   Procedure: LEFT LONG FINGER EXCISION MASS;  Surgeon: Leanora Cover, MD;  Location: [facility];  Service: Orthopedics;  Laterality: Left;  . REFRACTIVE SURGERY Bilateral   . TONSILLECTOMY    . TRIGGER  FINGER RELEASE Right 03/23/2009   index and long fingers  . VAGINAL HYSTERECTOMY  03/07/2003     OB History   No obstetric history on file.     Family History  Problem Relation Age of Onset  . Depression Father   . CAD Father   . CVA Mother   . CAD Mother   . Gout Sister   . Heart Problems Son        degenerate bones and heart  . Other Sister        respiratory issues and lung surgery  . Arthritis Son     Social History   Tobacco Use  . Smoking status: Never Smoker  . Smokeless tobacco: Never Used  Substance Use Topics  . Alcohol use: No    Alcohol/week: 0.0 standard drinks  . Drug use: No    Home Medications Prior to Admission medications   Medication Sig Start Date End Date Taking? Authorizing Provider  alendronate (FOSAMAX) 70 MG tablet 1 TABLET WEEKLY BY MOUTH 90 DAYS 09/11/19   [provider]  aspirin EC 81 MG tablet Take 81 mg by mouth daily.    [provider]  calcium-vitamin D (OSCAL WITH D) 500-200 MG-UNIT tablet Take 1 tablet by mouth 2 (two) times daily.    [provider]  cholecalciferol (VITAMIN D)  1000 units tablet Take 2,000 Units by mouth daily.    [provider]  clonazePAM (KLONOPIN) 0.5 MG tablet Take 0.5 mg by mouth at bedtime.     [provider]  cyclobenzaprine (FLEXERIL) 10 MG tablet Take by mouth.    [provider]  fenofibrate micronized (LOFIBRA) 134 MG capsule Take 134 mg by mouth daily. 05/19/19   [provider]  folic acid (FOLVITE) Q000111Q MCG tablet Take 800 mcg by mouth daily.     [provider]  glucosamine-chondroitin 500-400 MG tablet Take 1 tablet by mouth 2 (two) times daily.    [provider]  HYDROcodone-acetaminophen [facility]) 5-325 MG tablet 1-2 tabs po q6 hours prn pain 01/02/17   Leanora Cover, MD  lisinopril-hydrochlorothiazide (PRINZIDE,ZESTORETIC) 20-12.5 MG tablet Take 2 tablets by mouth daily.    [provider]  Magnesium 500 MG  CAPS Take 500 mg by mouth daily.    [provider]  metoprolol succinate (TOPROL-XL) 50 MG 24 hr tablet Take 50 mg by mouth daily. 10/20/19   [provider]  metroNIDAZOLE (FLAGYL) 250 MG tablet Take 250 mg by mouth 3 (three) times daily. 11/06/19   [provider]  omeprazole (PRILOSEC) 20 MG capsule Take 40 mg by mouth daily.    [provider]  predniSONE (STERAPRED UNI-PAK 21 TAB) 5 MG (21) TBPK tablet SMARTSIG:1 Pack(s) By Mouth As Directed 11/03/19   [provider]  rosuvastatin (CRESTOR) 20 MG tablet Take 20 mg by mouth every other day. 11/05/19   [provider]  traMADol (ULTRAM) 50 MG tablet Take by mouth.    [provider]  vitamin E 400 UNIT capsule Take 400 Units by mouth daily.    [provider]    Allergies    Ceclor [cefaclor], Macrodantin [nitrofurantoin], and Vancomycin  Review of Systems   Review of Systems  Constitutional: Positive for appetite change and fatigue. Negative for fever.  HENT: Negative for congestion.   Respiratory: Negative for shortness of breath.   Gastrointestinal: Positive for diarrhea. Negative for abdominal pain.  Genitourinary: Negative for frequency.  Musculoskeletal: Negative for back pain.  Skin: Negative for rash.  Neurological: Negative for weakness.  Psychiatric/Behavioral: Negative for confusion.    Physical Exam Updated Vital Signs BP (!) 134/59 (BP Location: Left Arm)   Pulse 73   Temp 97.7 F (36.5 C) (Oral)   Resp (!) 21   SpO2 93%   Physical Exam Vitals reviewed.  HENT:     Head: Normocephalic.  Eyes:     Pupils: Pupils are equal, round, and reactive to light.  Cardiovascular:     Rate and Rhythm: Regular rhythm.  Abdominal:     Tenderness: There is no abdominal tenderness.  Musculoskeletal:     Cervical back: Neck supple.     Right lower leg: No edema.     Left lower leg: No edema.  Skin:    General: Skin is warm.     Capillary Refill:  Capillary refill takes less than 2 seconds.  Neurological:     Mental Status: She is alert and oriented to person, place, and time.     ED Results / Procedures / Treatments   Labs (all labs ordered are listed, but only abnormal results are displayed) Labs Reviewed  CBC WITH DIFFERENTIAL/PLATELET - Abnormal; Notable for the following components:      Result Value   Platelets 434 (*)    Neutro Abs 8.4 (*)    Lymphs Abs  0.6 (*)    Abs Immature Granulocytes 0.10 (*)    All other components within normal limits  COMPREHENSIVE METABOLIC PANEL - Abnormal; Notable for the following components:   Sodium 130 (*)    Potassium 3.2 (*)    Chloride 95 (*)    CO2 20 (*)    Glucose, Bld 105 (*)    BUN 37 (*)    Creatinine, Ser 1.48 (*)    Alkaline Phosphatase 34 (*)    GFR calc non Af Amer 33 (*)    GFR calc Af Amer 39 (*)    All other components within normal limits  URINALYSIS, ROUTINE W REFLEX MICROSCOPIC    EKG None  Radiology No results found.  Procedures Procedures (including critical care time)  Medications Ordered in ED Medications  lactated ringers bolus 1,000 mL (has no administration in time range)  potassium chloride 10 mEq in 100 mL IVPB (has no administration in time range)  sodium chloride 0.9 % bolus 1,000 mL (1,000 mLs Intravenous New Bag/Given 11/10/19 1428)    ED Course  I have reviewed the triage vital signs and the nursing notes.  Pertinent labs & imaging results that were available during my care of the patient were reviewed by me and considered in my medical decision making (see chart for details).    MDM Rules/Calculators/A&P                      Patient with diarrhea and Covid positive.  States decreased oral intake.  Has some mild elevation creatinine and some electrolyte abnormalities.  Has had 1 L of normal saline and will add another liter of lactated Ringer's and potassium infusion.  Patient states she wants to be admitted to the hospital but will  see how she tolerates more hydration. Final Clinical Impression(s) / ED Diagnoses Final diagnoses:  COVID-19  Dehydration    Rx / DC Orders ED Discharge Orders    None       Davonna Belling, MD 11/10/19 1550

## 2019-11-10 NOTE — Discharge Instructions (Addendum)
You have mild dehydration.   Stay hydrated. Drink ensure   Take zofran for nausea   See your doctor  Return to ER if you have worse abdominal pain, vomiting, fever, trouble breathing, unable to keep anything down

## 2019-11-10 NOTE — ED Notes (Signed)
Pure wic placed by NT.

## 2019-11-10 NOTE — ED Triage Notes (Signed)
Pt brought in from home by EMS. Pt is covid positive and complains of diarrhea since 1/5. Pt denies dizziness, n/v, and abdominal pain. Pt reports taking 1000mg  Tylenol at 9am. Received last covid infusion at [facility] on 1/11. Pt AO x4. Pt ambulates independently.

## 2019-11-12 DIAGNOSIS — U071 COVID-19: Secondary | ICD-10-CM | POA: Diagnosis not present

## 2019-11-12 DIAGNOSIS — R197 Diarrhea, unspecified: Secondary | ICD-10-CM | POA: Diagnosis not present

## 2019-12-17 DIAGNOSIS — M85852 Other specified disorders of bone density and structure, left thigh: Secondary | ICD-10-CM | POA: Diagnosis not present

## 2019-12-17 DIAGNOSIS — N183 Chronic kidney disease, stage 3 unspecified: Secondary | ICD-10-CM | POA: Diagnosis not present

## 2019-12-17 DIAGNOSIS — E538 Deficiency of other specified B group vitamins: Secondary | ICD-10-CM | POA: Diagnosis not present

## 2019-12-17 DIAGNOSIS — R69 Illness, unspecified: Secondary | ICD-10-CM | POA: Diagnosis not present

## 2019-12-17 DIAGNOSIS — I10 Essential (primary) hypertension: Secondary | ICD-10-CM | POA: Diagnosis not present

## 2019-12-17 DIAGNOSIS — E782 Mixed hyperlipidemia: Secondary | ICD-10-CM | POA: Diagnosis not present

## 2019-12-17 DIAGNOSIS — K219 Gastro-esophageal reflux disease without esophagitis: Secondary | ICD-10-CM | POA: Diagnosis not present

## 2019-12-17 DIAGNOSIS — M545 Low back pain: Secondary | ICD-10-CM | POA: Diagnosis not present

## 2019-12-17 DIAGNOSIS — H409 Unspecified glaucoma: Secondary | ICD-10-CM | POA: Diagnosis not present

## 2019-12-20 DIAGNOSIS — E781 Pure hyperglyceridemia: Secondary | ICD-10-CM | POA: Diagnosis not present

## 2019-12-20 DIAGNOSIS — I10 Essential (primary) hypertension: Secondary | ICD-10-CM | POA: Diagnosis not present

## 2019-12-20 DIAGNOSIS — E782 Mixed hyperlipidemia: Secondary | ICD-10-CM | POA: Diagnosis not present

## 2019-12-20 DIAGNOSIS — H409 Unspecified glaucoma: Secondary | ICD-10-CM | POA: Diagnosis not present

## 2019-12-20 DIAGNOSIS — N183 Chronic kidney disease, stage 3 unspecified: Secondary | ICD-10-CM | POA: Diagnosis not present

## 2019-12-24 DIAGNOSIS — K219 Gastro-esophageal reflux disease without esophagitis: Secondary | ICD-10-CM | POA: Diagnosis not present

## 2019-12-24 DIAGNOSIS — I739 Peripheral vascular disease, unspecified: Secondary | ICD-10-CM | POA: Diagnosis not present

## 2019-12-24 DIAGNOSIS — Z1389 Encounter for screening for other disorder: Secondary | ICD-10-CM | POA: Diagnosis not present

## 2019-12-24 DIAGNOSIS — E782 Mixed hyperlipidemia: Secondary | ICD-10-CM | POA: Diagnosis not present

## 2019-12-24 DIAGNOSIS — M85852 Other specified disorders of bone density and structure, left thigh: Secondary | ICD-10-CM | POA: Diagnosis not present

## 2019-12-24 DIAGNOSIS — I10 Essential (primary) hypertension: Secondary | ICD-10-CM | POA: Diagnosis not present

## 2019-12-24 DIAGNOSIS — R69 Illness, unspecified: Secondary | ICD-10-CM | POA: Diagnosis not present

## 2019-12-24 DIAGNOSIS — Z0000 Encounter for general adult medical examination without abnormal findings: Secondary | ICD-10-CM | POA: Diagnosis not present

## 2019-12-24 DIAGNOSIS — Z79899 Other long term (current) drug therapy: Secondary | ICD-10-CM | POA: Diagnosis not present

## 2019-12-24 DIAGNOSIS — J309 Allergic rhinitis, unspecified: Secondary | ICD-10-CM | POA: Diagnosis not present

## 2020-01-21 ENCOUNTER — Ambulatory Visit: Payer: Medicare HMO | Attending: Internal Medicine

## 2020-01-21 DIAGNOSIS — Z23 Encounter for immunization: Secondary | ICD-10-CM

## 2020-01-21 NOTE — Progress Notes (Signed)
   Covid-19 Vaccination Clinic  Name:  Kaitlyn Newton    MRN: [id number] DOB: 12/21/1940  01/21/2020  [name] was observed post Covid-19 immunization for 15 minutes without incident. She was provided with Vaccine Information Sheet and instruction to access the V-Safe system.   [name] was instructed to call 911 with any severe reactions post vaccine: Marland Kitchen Difficulty breathing  . Swelling of face and throat  . A fast heartbeat  . A bad rash all over body  . Dizziness and weakness   Immunizations Administered    Name Date Dose VIS Date Route   Pfizer COVID-19 Vaccine 01/21/2020  9:46 AM 0.3 mL 10/08/2019 Intramuscular   Manufacturer: [place]   Lot: [id number]   [place]: [id number]

## 2020-02-14 ENCOUNTER — Ambulatory Visit: Payer: Medicare HMO | Attending: Internal Medicine

## 2020-02-14 DIAGNOSIS — Z23 Encounter for immunization: Secondary | ICD-10-CM

## 2020-02-14 NOTE — Progress Notes (Signed)
   Covid-19 Vaccination Clinic  Name:  Kaitlyn Newton    MRN: [id number] DOB: 12-16-1940  02/14/2020  Kaitlyn Newton was observed post Covid-19 immunization for 15 minutes without incident. She was provided with Vaccine Information Sheet and instruction to access the V-Safe system.   Kaitlyn Newton was instructed to call 911 with any severe reactions post vaccine: Marland Kitchen Difficulty breathing  . Swelling of face and throat  . A fast heartbeat  . A bad rash all over body  . Dizziness and weakness   Immunizations Administered    Name Date Dose VIS Date Route   Pfizer COVID-19 Vaccine 02/14/2020  2:18 PM 0.3 mL 12/22/2018 Intramuscular   Manufacturer: [place]   Lot: [id number]   [place]: [id number]

## 2020-04-10 DIAGNOSIS — Z1231 Encounter for screening mammogram for malignant neoplasm of breast: Secondary | ICD-10-CM | POA: Diagnosis not present

## 2020-04-19 DIAGNOSIS — M545 Low back pain: Secondary | ICD-10-CM | POA: Diagnosis not present

## 2020-04-19 DIAGNOSIS — M48062 Spinal stenosis, lumbar region with neurogenic claudication: Secondary | ICD-10-CM | POA: Diagnosis not present

## 2020-04-21 DIAGNOSIS — M545 Low back pain: Secondary | ICD-10-CM | POA: Diagnosis not present

## 2020-04-27 DIAGNOSIS — M545 Low back pain: Secondary | ICD-10-CM | POA: Diagnosis not present

## 2020-05-02 DIAGNOSIS — L821 Other seborrheic keratosis: Secondary | ICD-10-CM | POA: Diagnosis not present

## 2020-05-02 DIAGNOSIS — L814 Other melanin hyperpigmentation: Secondary | ICD-10-CM | POA: Diagnosis not present

## 2020-05-02 DIAGNOSIS — D225 Melanocytic nevi of trunk: Secondary | ICD-10-CM | POA: Diagnosis not present

## 2020-05-02 DIAGNOSIS — D224 Melanocytic nevi of scalp and neck: Secondary | ICD-10-CM | POA: Diagnosis not present

## 2020-05-18 DIAGNOSIS — M5136 Other intervertebral disc degeneration, lumbar region: Secondary | ICD-10-CM | POA: Diagnosis not present

## 2020-06-05 DIAGNOSIS — M47896 Other spondylosis, lumbar region: Secondary | ICD-10-CM | POA: Diagnosis not present

## 2020-06-05 DIAGNOSIS — M47816 Spondylosis without myelopathy or radiculopathy, lumbar region: Secondary | ICD-10-CM | POA: Diagnosis not present

## 2020-06-15 DIAGNOSIS — R197 Diarrhea, unspecified: Secondary | ICD-10-CM | POA: Diagnosis not present

## 2020-06-22 DIAGNOSIS — K219 Gastro-esophageal reflux disease without esophagitis: Secondary | ICD-10-CM | POA: Diagnosis not present

## 2020-06-22 DIAGNOSIS — I10 Essential (primary) hypertension: Secondary | ICD-10-CM | POA: Diagnosis not present

## 2020-06-22 DIAGNOSIS — R197 Diarrhea, unspecified: Secondary | ICD-10-CM | POA: Diagnosis not present

## 2020-06-22 DIAGNOSIS — R69 Illness, unspecified: Secondary | ICD-10-CM | POA: Diagnosis not present

## 2020-06-22 DIAGNOSIS — Z79899 Other long term (current) drug therapy: Secondary | ICD-10-CM | POA: Diagnosis not present

## 2020-06-22 DIAGNOSIS — E782 Mixed hyperlipidemia: Secondary | ICD-10-CM | POA: Diagnosis not present

## 2020-06-22 DIAGNOSIS — Z0000 Encounter for general adult medical examination without abnormal findings: Secondary | ICD-10-CM | POA: Diagnosis not present

## 2020-06-22 DIAGNOSIS — I739 Peripheral vascular disease, unspecified: Secondary | ICD-10-CM | POA: Diagnosis not present

## 2020-06-22 DIAGNOSIS — M85852 Other specified disorders of bone density and structure, left thigh: Secondary | ICD-10-CM | POA: Diagnosis not present

## 2020-06-22 DIAGNOSIS — J309 Allergic rhinitis, unspecified: Secondary | ICD-10-CM | POA: Diagnosis not present

## 2020-06-23 DIAGNOSIS — R197 Diarrhea, unspecified: Secondary | ICD-10-CM | POA: Diagnosis not present

## 2020-07-04 DIAGNOSIS — H40053 Ocular hypertension, bilateral: Secondary | ICD-10-CM | POA: Diagnosis not present

## 2020-07-04 DIAGNOSIS — H40033 Anatomical narrow angle, bilateral: Secondary | ICD-10-CM | POA: Diagnosis not present

## 2020-07-04 DIAGNOSIS — H524 Presbyopia: Secondary | ICD-10-CM | POA: Diagnosis not present

## 2020-07-04 DIAGNOSIS — Z961 Presence of intraocular lens: Secondary | ICD-10-CM | POA: Diagnosis not present

## 2020-07-07 DIAGNOSIS — K219 Gastro-esophageal reflux disease without esophagitis: Secondary | ICD-10-CM | POA: Diagnosis not present

## 2020-07-07 DIAGNOSIS — J309 Allergic rhinitis, unspecified: Secondary | ICD-10-CM | POA: Diagnosis not present

## 2020-07-07 DIAGNOSIS — M545 Low back pain: Secondary | ICD-10-CM | POA: Diagnosis not present

## 2020-07-07 DIAGNOSIS — I10 Essential (primary) hypertension: Secondary | ICD-10-CM | POA: Diagnosis not present

## 2020-07-07 DIAGNOSIS — Z23 Encounter for immunization: Secondary | ICD-10-CM | POA: Diagnosis not present

## 2020-07-07 DIAGNOSIS — E782 Mixed hyperlipidemia: Secondary | ICD-10-CM | POA: Diagnosis not present

## 2020-07-07 DIAGNOSIS — R69 Illness, unspecified: Secondary | ICD-10-CM | POA: Diagnosis not present

## 2020-07-07 DIAGNOSIS — M85852 Other specified disorders of bone density and structure, left thigh: Secondary | ICD-10-CM | POA: Diagnosis not present

## 2020-07-07 DIAGNOSIS — I739 Peripheral vascular disease, unspecified: Secondary | ICD-10-CM | POA: Diagnosis not present

## 2020-07-07 DIAGNOSIS — R197 Diarrhea, unspecified: Secondary | ICD-10-CM | POA: Diagnosis not present

## 2020-07-12 IMAGING — CR DG LUMBAR SPINE 2-3V
3 series · 3 of 3 positions shown · non-contrast
Comparison: None.

CLINICAL DATA: Chronic LBP x 3 years post falling / increased LBP x
spine since last IMAGES 3 years prior

EXAM:
LUMBAR SPINE - 2-3 VIEW

[w lumbar spine ap]
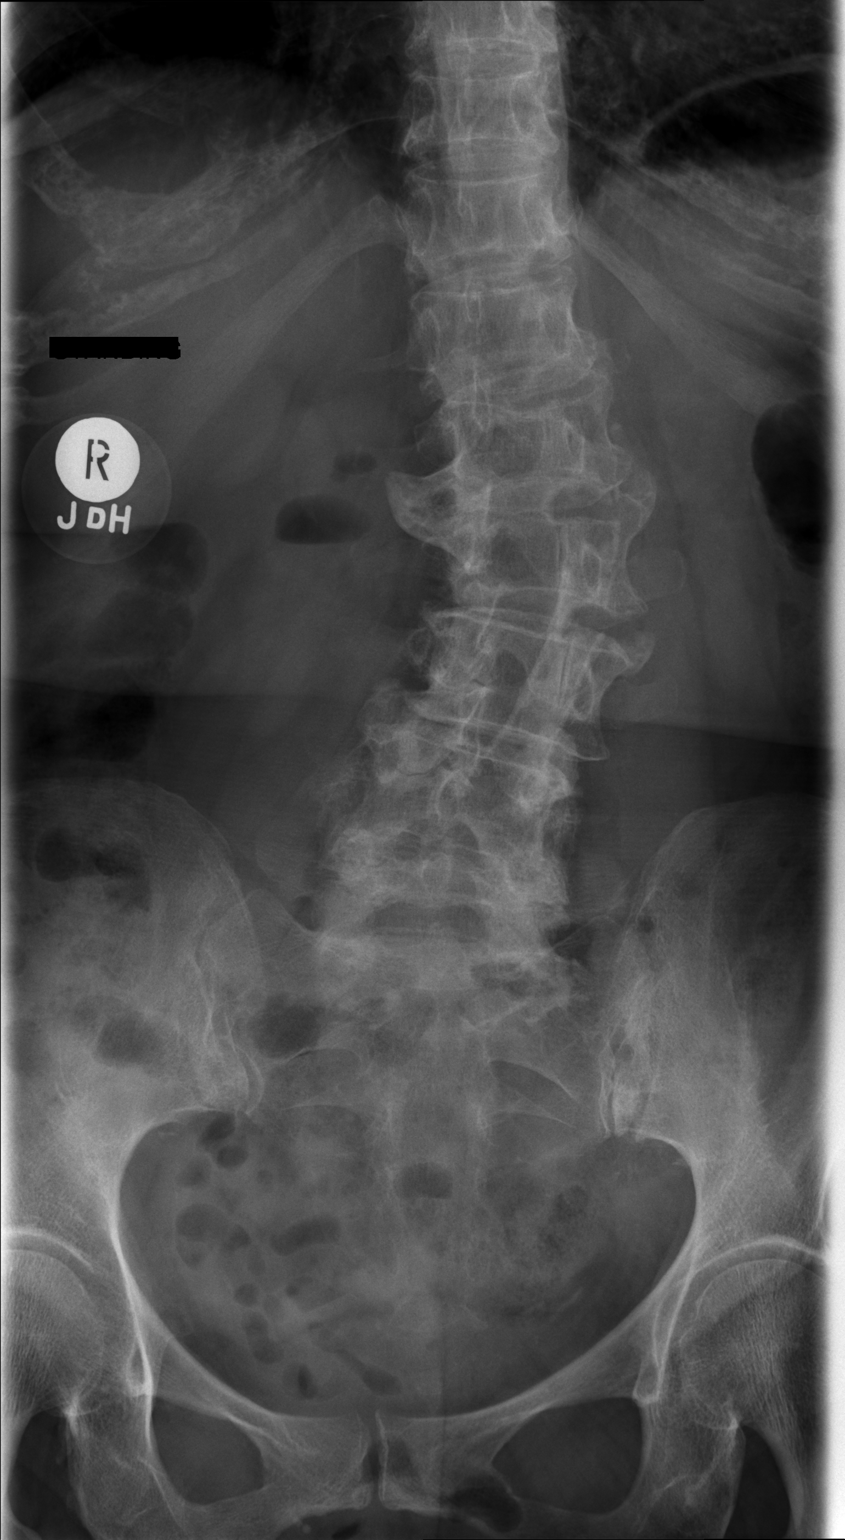

[w lumbar spine lat]
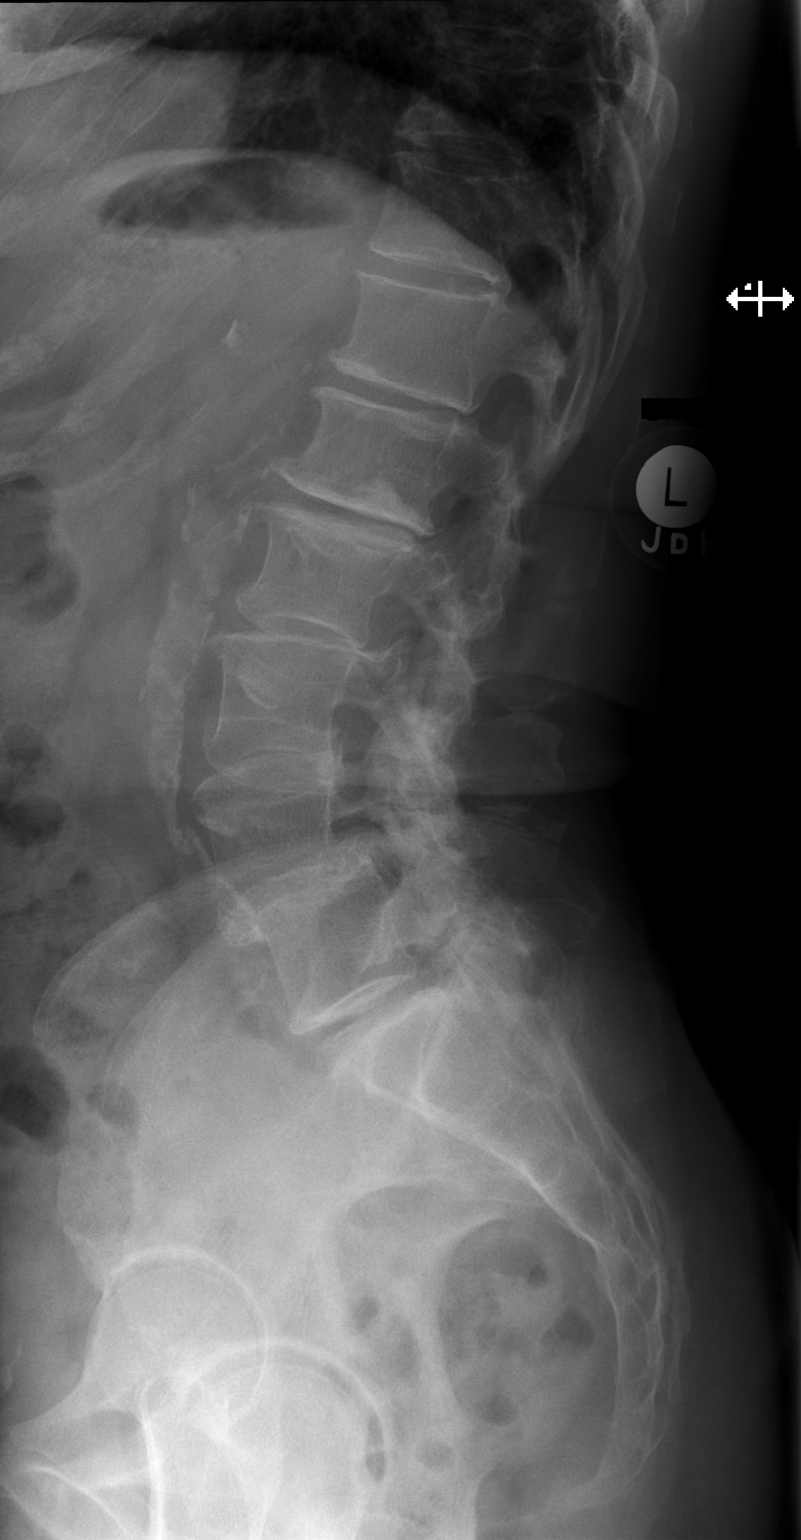

[w lumbar l-5 s-1 spot]
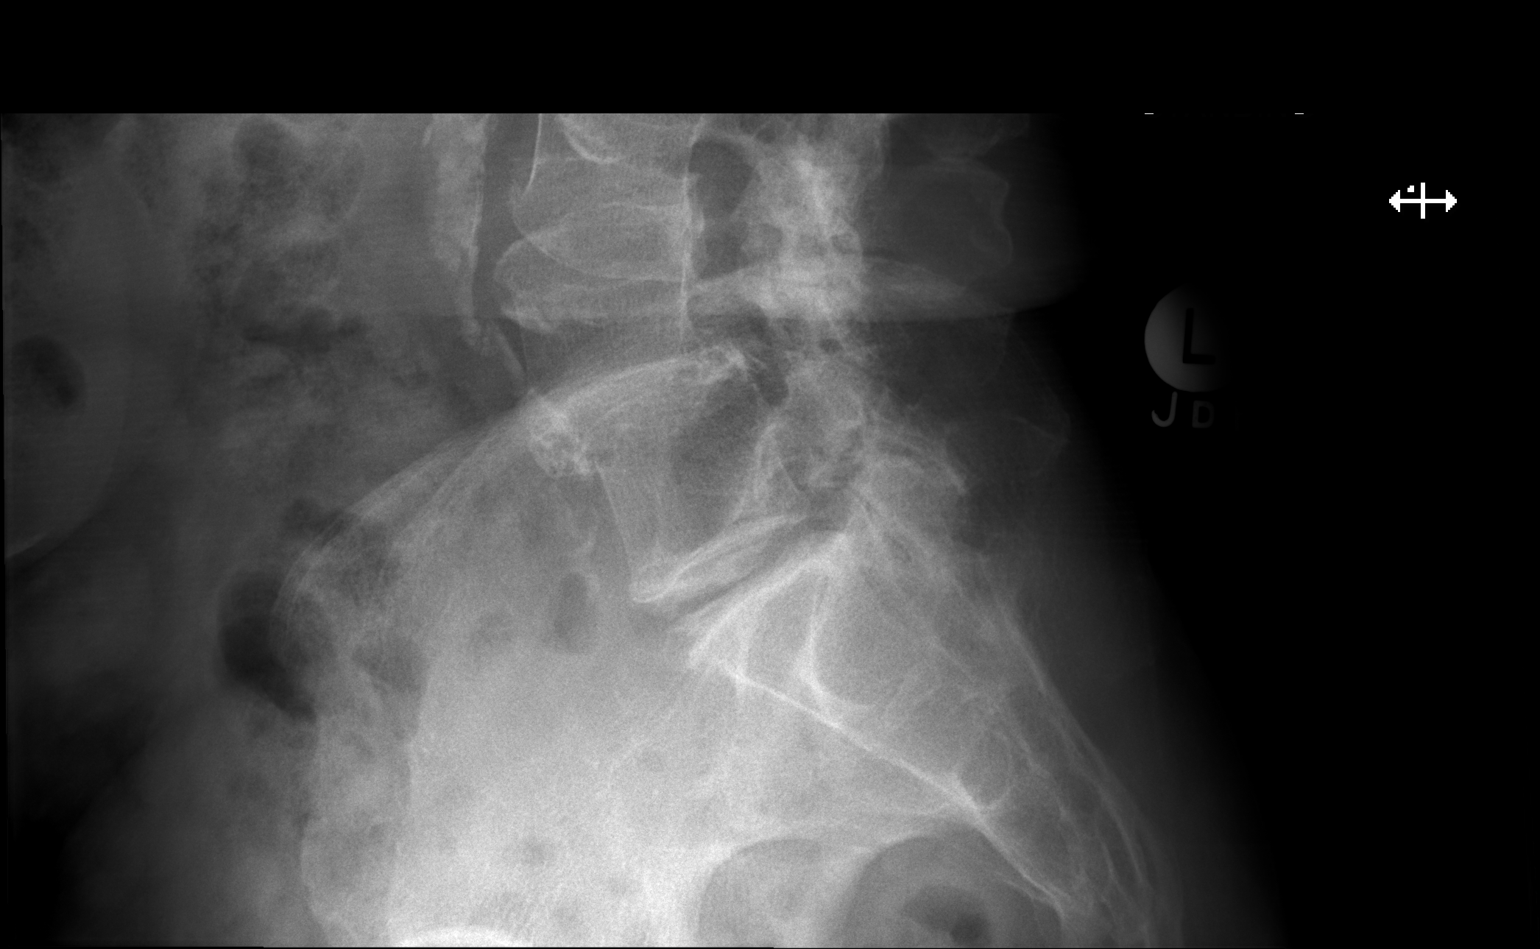

[3 of 3 positions shown; findings below may reference images not displayed]

FINDINGS: No fracture. There is a grade 1 to grade 2 anterolisthesis of L4 on
L5. No other spondylolisthesis. On the AP view, there is a moderate
levoscoliosis, apex at L2-L3.

There are 6 non rib-bearing lumbar type vertebra. The lowest of
these is states they needed L5 purposes of this dictation.

Mild loss of disc height at L1-L2, L2-L3 and L3-L4. Moderate loss
disc height at L4-L5. Mild loss disc height at L5-S1.

Facet degenerative changes are evident along the mid to lower lumbar
spine. There endplate osteophytes most prominent at L1-L2.

Bones are demineralized.

There are dense calcifications along a normal caliber abdominal
aorta.
IMPRESSION: 1. No fracture or acute finding.
2. Degenerative changes as described including a grade 1 to grade 2
anterolisthesis of L4 on L5.
3. Moderate levoscoliosis.

## 2020-07-14 DIAGNOSIS — M7061 Trochanteric bursitis, right hip: Secondary | ICD-10-CM | POA: Diagnosis not present

## 2020-07-14 DIAGNOSIS — M47816 Spondylosis without myelopathy or radiculopathy, lumbar region: Secondary | ICD-10-CM | POA: Diagnosis not present

## 2020-07-14 DIAGNOSIS — M47896 Other spondylosis, lumbar region: Secondary | ICD-10-CM | POA: Diagnosis not present

## 2020-07-14 DIAGNOSIS — M7062 Trochanteric bursitis, left hip: Secondary | ICD-10-CM | POA: Diagnosis not present

## 2020-07-20 DIAGNOSIS — R197 Diarrhea, unspecified: Secondary | ICD-10-CM | POA: Diagnosis not present

## 2020-07-20 DIAGNOSIS — K580 Irritable bowel syndrome with diarrhea: Secondary | ICD-10-CM | POA: Diagnosis not present

## 2020-08-07 DIAGNOSIS — Z1159 Encounter for screening for other viral diseases: Secondary | ICD-10-CM | POA: Diagnosis not present

## 2020-08-10 DIAGNOSIS — K648 Other hemorrhoids: Secondary | ICD-10-CM | POA: Diagnosis not present

## 2020-08-10 DIAGNOSIS — K5289 Other specified noninfective gastroenteritis and colitis: Secondary | ICD-10-CM | POA: Diagnosis not present

## 2020-08-10 DIAGNOSIS — K529 Noninfective gastroenteritis and colitis, unspecified: Secondary | ICD-10-CM | POA: Diagnosis not present

## 2020-08-15 DIAGNOSIS — K5289 Other specified noninfective gastroenteritis and colitis: Secondary | ICD-10-CM | POA: Diagnosis not present

## 2020-08-15 DIAGNOSIS — K529 Noninfective gastroenteritis and colitis, unspecified: Secondary | ICD-10-CM | POA: Diagnosis not present

## 2020-09-12 DIAGNOSIS — M47816 Spondylosis without myelopathy or radiculopathy, lumbar region: Secondary | ICD-10-CM | POA: Diagnosis not present

## 2020-09-27 DIAGNOSIS — M47816 Spondylosis without myelopathy or radiculopathy, lumbar region: Secondary | ICD-10-CM | POA: Diagnosis not present

## 2020-09-27 DIAGNOSIS — M25511 Pain in right shoulder: Secondary | ICD-10-CM | POA: Diagnosis not present

## 2020-10-26 DIAGNOSIS — M479 Spondylosis, unspecified: Secondary | ICD-10-CM | POA: Diagnosis not present

## 2020-10-26 DIAGNOSIS — M25511 Pain in right shoulder: Secondary | ICD-10-CM | POA: Diagnosis not present

## 2020-11-16 DIAGNOSIS — K580 Irritable bowel syndrome with diarrhea: Secondary | ICD-10-CM | POA: Diagnosis not present

## 2020-11-22 DIAGNOSIS — M25511 Pain in right shoulder: Secondary | ICD-10-CM | POA: Diagnosis not present

## 2020-12-07 DIAGNOSIS — M19111 Post-traumatic osteoarthritis, right shoulder: Secondary | ICD-10-CM | POA: Diagnosis not present

## 2020-12-29 DIAGNOSIS — I10 Essential (primary) hypertension: Secondary | ICD-10-CM | POA: Diagnosis not present

## 2020-12-29 DIAGNOSIS — E782 Mixed hyperlipidemia: Secondary | ICD-10-CM | POA: Diagnosis not present

## 2020-12-29 DIAGNOSIS — Z79899 Other long term (current) drug therapy: Secondary | ICD-10-CM | POA: Diagnosis not present

## 2020-12-29 DIAGNOSIS — M85852 Other specified disorders of bone density and structure, left thigh: Secondary | ICD-10-CM | POA: Diagnosis not present

## 2021-01-02 DIAGNOSIS — K580 Irritable bowel syndrome with diarrhea: Secondary | ICD-10-CM | POA: Diagnosis not present

## 2021-01-02 DIAGNOSIS — R69 Illness, unspecified: Secondary | ICD-10-CM | POA: Diagnosis not present

## 2021-01-02 DIAGNOSIS — Z23 Encounter for immunization: Secondary | ICD-10-CM | POA: Diagnosis not present

## 2021-01-02 DIAGNOSIS — Z0000 Encounter for general adult medical examination without abnormal findings: Secondary | ICD-10-CM | POA: Diagnosis not present

## 2021-01-02 DIAGNOSIS — M5451 Vertebrogenic low back pain: Secondary | ICD-10-CM | POA: Diagnosis not present

## 2021-01-02 DIAGNOSIS — I10 Essential (primary) hypertension: Secondary | ICD-10-CM | POA: Diagnosis not present

## 2021-01-02 DIAGNOSIS — Z1389 Encounter for screening for other disorder: Secondary | ICD-10-CM | POA: Diagnosis not present

## 2021-01-02 DIAGNOSIS — M85852 Other specified disorders of bone density and structure, left thigh: Secondary | ICD-10-CM | POA: Diagnosis not present

## 2021-01-02 DIAGNOSIS — K219 Gastro-esophageal reflux disease without esophagitis: Secondary | ICD-10-CM | POA: Diagnosis not present

## 2021-01-02 DIAGNOSIS — E782 Mixed hyperlipidemia: Secondary | ICD-10-CM | POA: Diagnosis not present

## 2021-02-12 DIAGNOSIS — M25511 Pain in right shoulder: Secondary | ICD-10-CM | POA: Diagnosis not present

## 2021-02-13 DIAGNOSIS — M47816 Spondylosis without myelopathy or radiculopathy, lumbar region: Secondary | ICD-10-CM | POA: Diagnosis not present

## 2021-03-27 DIAGNOSIS — M47816 Spondylosis without myelopathy or radiculopathy, lumbar region: Secondary | ICD-10-CM | POA: Diagnosis not present

## 2021-03-28 DIAGNOSIS — R3915 Urgency of urination: Secondary | ICD-10-CM | POA: Diagnosis not present

## 2021-03-28 DIAGNOSIS — Z6825 Body mass index (BMI) 25.0-25.9, adult: Secondary | ICD-10-CM | POA: Diagnosis not present

## 2021-04-16 DIAGNOSIS — M47816 Spondylosis without myelopathy or radiculopathy, lumbar region: Secondary | ICD-10-CM | POA: Diagnosis not present

## 2021-04-16 DIAGNOSIS — Z1231 Encounter for screening mammogram for malignant neoplasm of breast: Secondary | ICD-10-CM | POA: Diagnosis not present

## 2021-05-04 DIAGNOSIS — J0100 Acute maxillary sinusitis, unspecified: Secondary | ICD-10-CM | POA: Diagnosis not present

## 2021-05-08 DIAGNOSIS — D225 Melanocytic nevi of trunk: Secondary | ICD-10-CM | POA: Diagnosis not present

## 2021-05-08 DIAGNOSIS — L821 Other seborrheic keratosis: Secondary | ICD-10-CM | POA: Diagnosis not present

## 2021-05-08 DIAGNOSIS — D2262 Melanocytic nevi of left upper limb, including shoulder: Secondary | ICD-10-CM | POA: Diagnosis not present

## 2021-05-31 DIAGNOSIS — M75121 Complete rotator cuff tear or rupture of right shoulder, not specified as traumatic: Secondary | ICD-10-CM | POA: Diagnosis not present

## 2021-05-31 DIAGNOSIS — M25511 Pain in right shoulder: Secondary | ICD-10-CM | POA: Diagnosis not present

## 2021-06-18 DIAGNOSIS — L503 Dermatographic urticaria: Secondary | ICD-10-CM | POA: Diagnosis not present

## 2021-07-09 DIAGNOSIS — Z961 Presence of intraocular lens: Secondary | ICD-10-CM | POA: Diagnosis not present

## 2021-07-09 DIAGNOSIS — H40033 Anatomical narrow angle, bilateral: Secondary | ICD-10-CM | POA: Diagnosis not present

## 2021-07-09 DIAGNOSIS — H40053 Ocular hypertension, bilateral: Secondary | ICD-10-CM | POA: Diagnosis not present

## 2021-07-09 DIAGNOSIS — H524 Presbyopia: Secondary | ICD-10-CM | POA: Diagnosis not present

## 2021-07-17 DIAGNOSIS — Z23 Encounter for immunization: Secondary | ICD-10-CM | POA: Diagnosis not present

## 2021-07-17 DIAGNOSIS — R69 Illness, unspecified: Secondary | ICD-10-CM | POA: Diagnosis not present

## 2021-07-17 DIAGNOSIS — E782 Mixed hyperlipidemia: Secondary | ICD-10-CM | POA: Diagnosis not present

## 2021-07-17 DIAGNOSIS — M5451 Vertebrogenic low back pain: Secondary | ICD-10-CM | POA: Diagnosis not present

## 2021-07-17 DIAGNOSIS — K580 Irritable bowel syndrome with diarrhea: Secondary | ICD-10-CM | POA: Diagnosis not present

## 2021-07-17 DIAGNOSIS — K219 Gastro-esophageal reflux disease without esophagitis: Secondary | ICD-10-CM | POA: Diagnosis not present

## 2021-07-17 DIAGNOSIS — M85852 Other specified disorders of bone density and structure, left thigh: Secondary | ICD-10-CM | POA: Diagnosis not present

## 2021-07-17 DIAGNOSIS — M75101 Unspecified rotator cuff tear or rupture of right shoulder, not specified as traumatic: Secondary | ICD-10-CM | POA: Diagnosis not present

## 2021-07-17 DIAGNOSIS — I10 Essential (primary) hypertension: Secondary | ICD-10-CM | POA: Diagnosis not present

## 2021-07-25 DIAGNOSIS — M25512 Pain in left shoulder: Secondary | ICD-10-CM | POA: Diagnosis not present

## 2021-07-26 DIAGNOSIS — M79671 Pain in right foot: Secondary | ICD-10-CM | POA: Diagnosis not present

## 2021-08-02 DIAGNOSIS — I10 Essential (primary) hypertension: Secondary | ICD-10-CM | POA: Diagnosis not present

## 2021-08-09 DIAGNOSIS — M75121 Complete rotator cuff tear or rupture of right shoulder, not specified as traumatic: Secondary | ICD-10-CM | POA: Diagnosis not present

## 2021-08-09 DIAGNOSIS — M25512 Pain in left shoulder: Secondary | ICD-10-CM | POA: Diagnosis not present

## 2021-08-14 DIAGNOSIS — M47816 Spondylosis without myelopathy or radiculopathy, lumbar region: Secondary | ICD-10-CM | POA: Diagnosis not present

## 2021-08-14 DIAGNOSIS — M8589 Other specified disorders of bone density and structure, multiple sites: Secondary | ICD-10-CM | POA: Diagnosis not present

## 2021-08-30 DIAGNOSIS — M545 Low back pain, unspecified: Secondary | ICD-10-CM | POA: Diagnosis not present

## 2021-08-30 DIAGNOSIS — M792 Neuralgia and neuritis, unspecified: Secondary | ICD-10-CM | POA: Diagnosis not present

## 2021-09-07 DIAGNOSIS — M5451 Vertebrogenic low back pain: Secondary | ICD-10-CM | POA: Diagnosis not present

## 2021-09-07 DIAGNOSIS — R69 Illness, unspecified: Secondary | ICD-10-CM | POA: Diagnosis not present

## 2021-09-07 DIAGNOSIS — I10 Essential (primary) hypertension: Secondary | ICD-10-CM | POA: Diagnosis not present

## 2021-10-11 DIAGNOSIS — J028 Acute pharyngitis due to other specified organisms: Secondary | ICD-10-CM | POA: Diagnosis not present

## 2021-10-11 DIAGNOSIS — Z03818 Encounter for observation for suspected exposure to other biological agents ruled out: Secondary | ICD-10-CM | POA: Diagnosis not present

## 2021-10-11 DIAGNOSIS — J069 Acute upper respiratory infection, unspecified: Secondary | ICD-10-CM | POA: Diagnosis not present

## 2021-11-22 DIAGNOSIS — M75121 Complete rotator cuff tear or rupture of right shoulder, not specified as traumatic: Secondary | ICD-10-CM | POA: Diagnosis not present

## 2021-11-22 DIAGNOSIS — M25511 Pain in right shoulder: Secondary | ICD-10-CM | POA: Diagnosis not present

## 2021-11-24 DIAGNOSIS — M792 Neuralgia and neuritis, unspecified: Secondary | ICD-10-CM | POA: Diagnosis not present

## 2021-11-24 DIAGNOSIS — M47816 Spondylosis without myelopathy or radiculopathy, lumbar region: Secondary | ICD-10-CM | POA: Diagnosis not present

## 2021-12-13 DIAGNOSIS — M47816 Spondylosis without myelopathy or radiculopathy, lumbar region: Secondary | ICD-10-CM | POA: Diagnosis not present

## 2021-12-19 DIAGNOSIS — M5136 Other intervertebral disc degeneration, lumbar region: Secondary | ICD-10-CM | POA: Diagnosis not present

## 2021-12-19 DIAGNOSIS — M47896 Other spondylosis, lumbar region: Secondary | ICD-10-CM | POA: Diagnosis not present

## 2022-01-01 IMAGING — CT CT ABD-PELV W/ CM
2 of 5 series · 16 of 46 positions shown, 18 images · IV contrast (omnipaque)
Comparison: None.

CLINICAL DATA: Abdominal distention and diarrhea. [id number].

EXAM:
CT ABDOMEN AND PELVIS WITH CONTRAST
TECHNIQUE: Multidetector CT imaging of the abdomen and pelvis was performed
using the standard protocol following bolus administration of
intravenous contrast.
CONTRAST:  75mL OMNIPAQUE IOHEXOL 300 MG/ML  SOLN

[Series 2: axial st · axial · 0.68mm/px · z∈[+415,+765]mm · 13 of 82 slices shown, 15 images]
[im 6/82  soft-tissue]
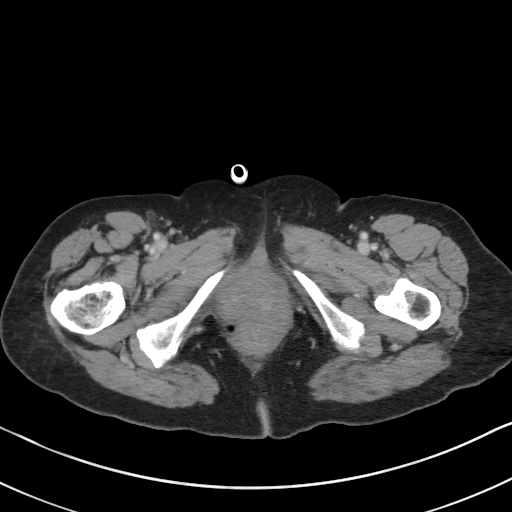
[im 6/82  bone]
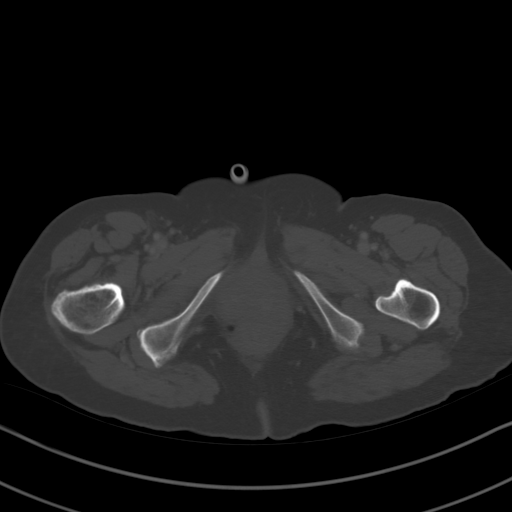
[im 11/82  soft-tissue]
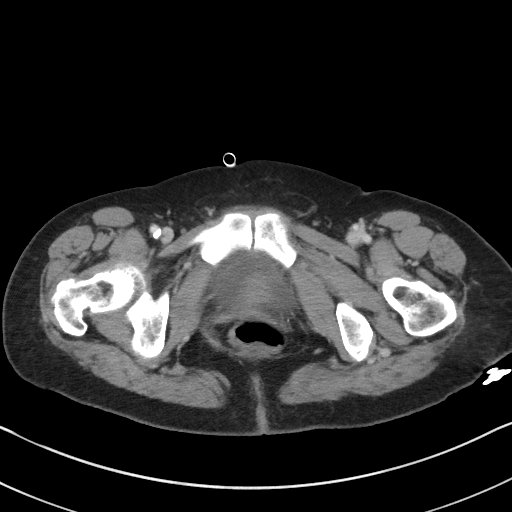
[im 17/82  soft-tissue]
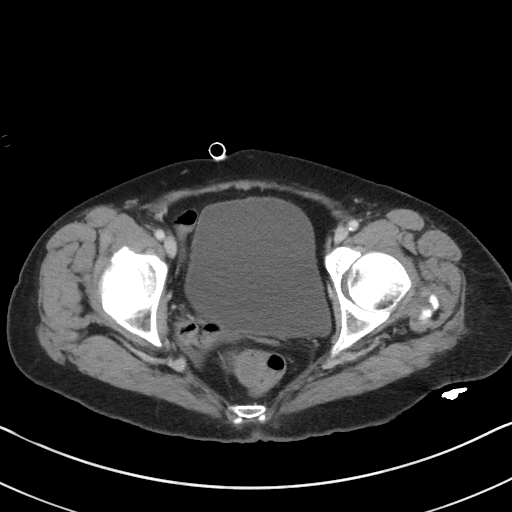
[im 22/82  soft-tissue]
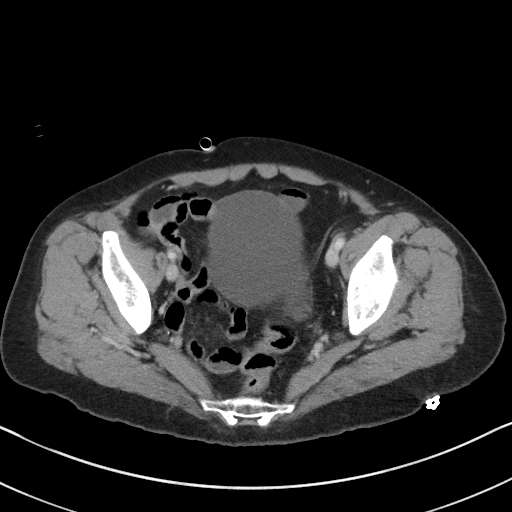
[im 28/82  soft-tissue]
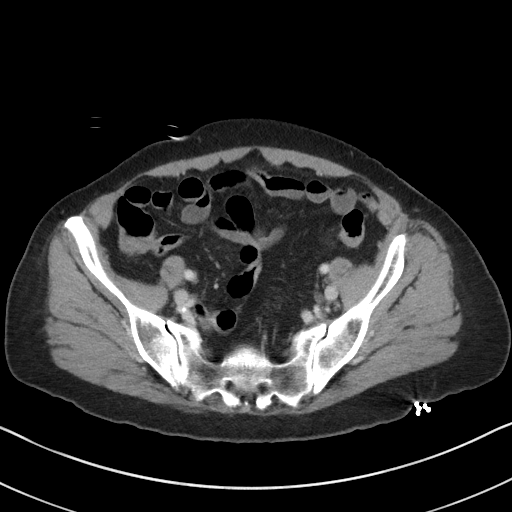
[im 33/82  soft-tissue]
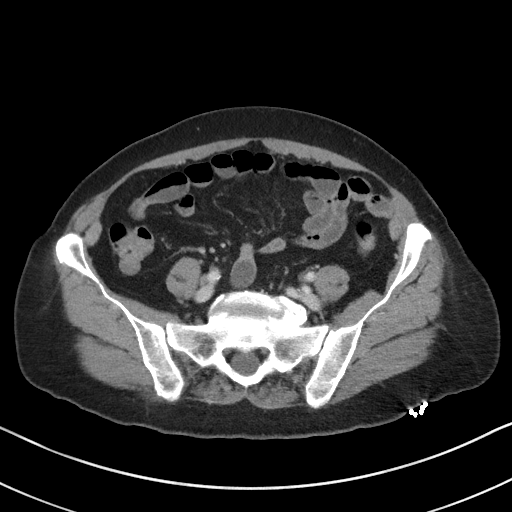
[im 44/82  soft-tissue]
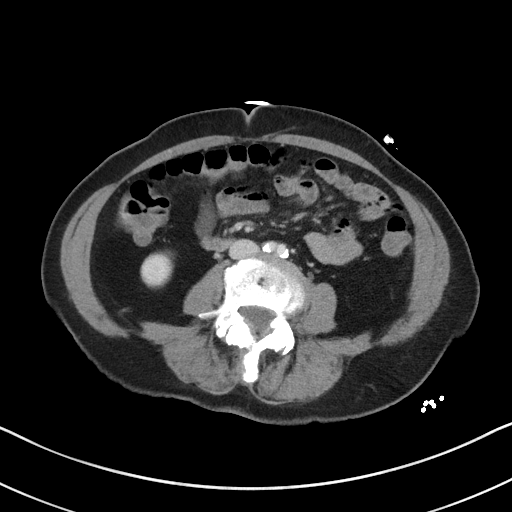
[im 49/82  soft-tissue]
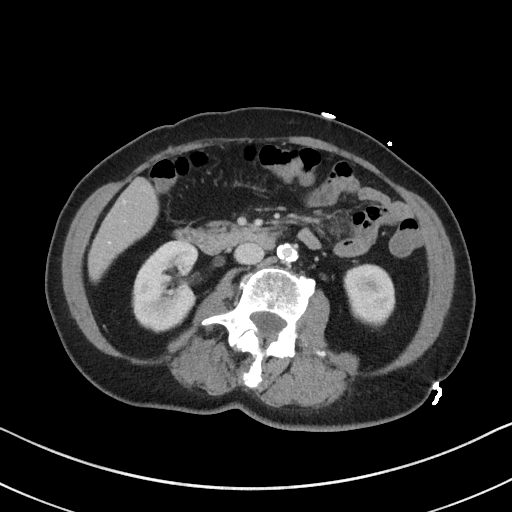
[im 55/82  soft-tissue]
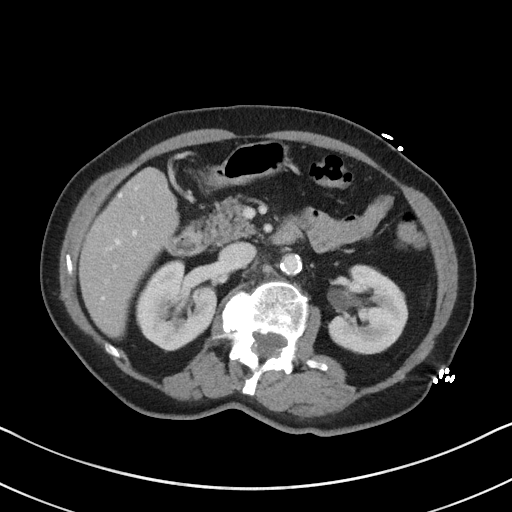
[im 55/82  bone]
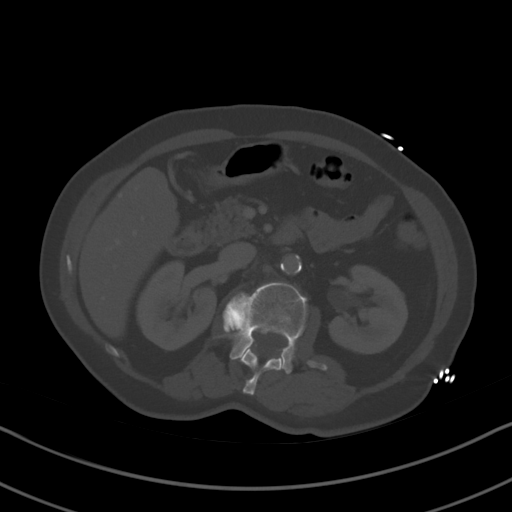
[im 60/82  soft-tissue]
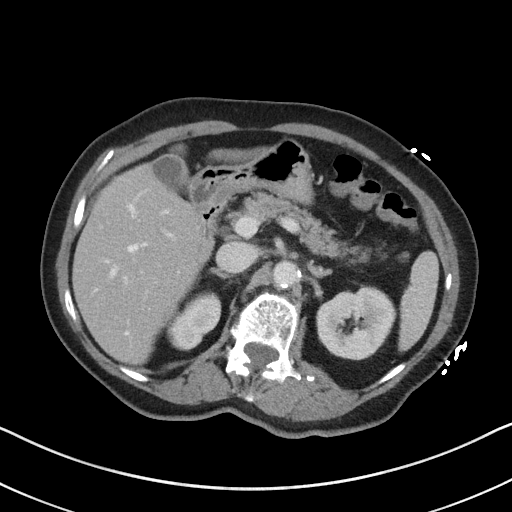
[im 65/82  soft-tissue]
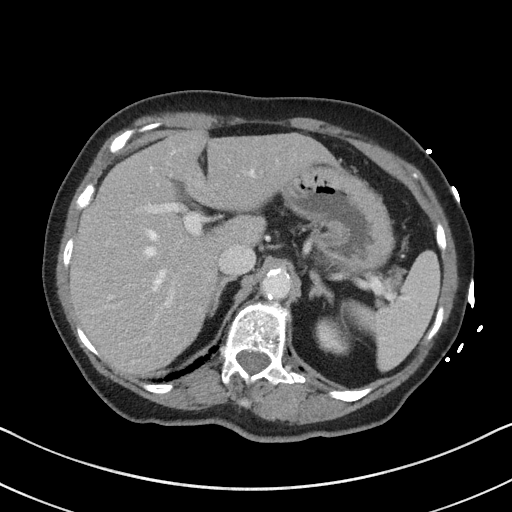
[im 71/82  soft-tissue]
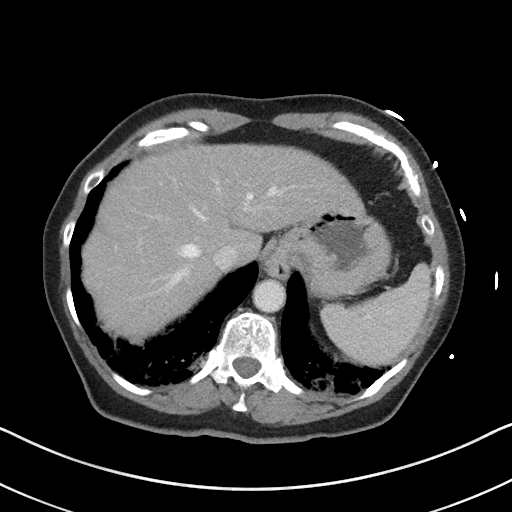
[im 76/82  soft-tissue]
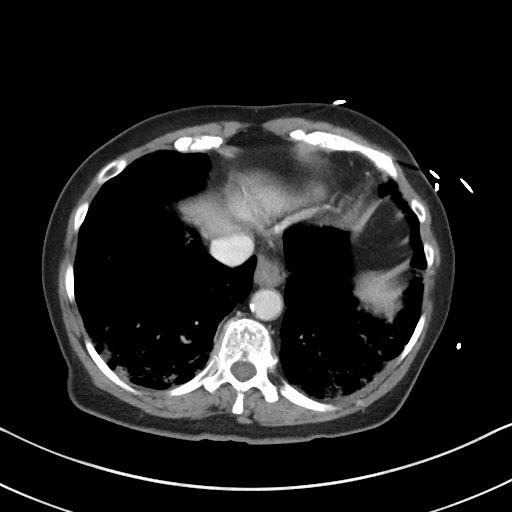

[Series 5: coronal st · coronal · 0.64mm/px · 3 of 78 slices shown]
[im 26/78  soft-tissue]
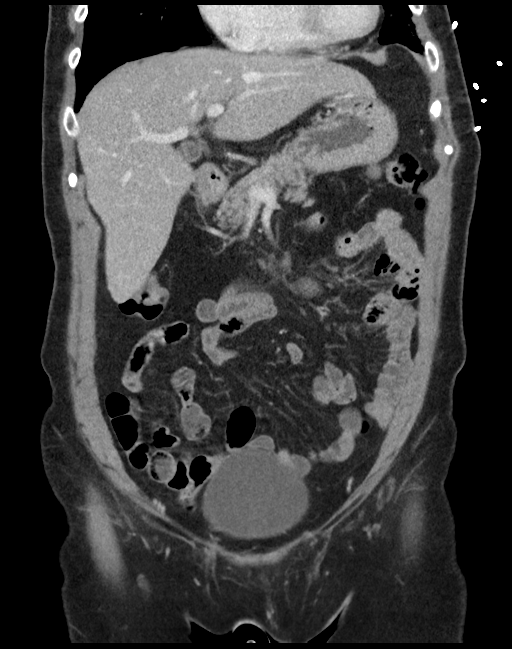
[im 35/78  soft-tissue]
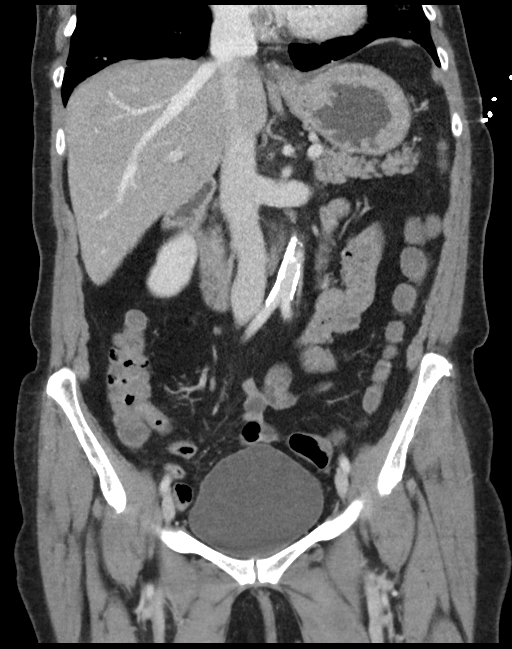
[im 43/78  soft-tissue]
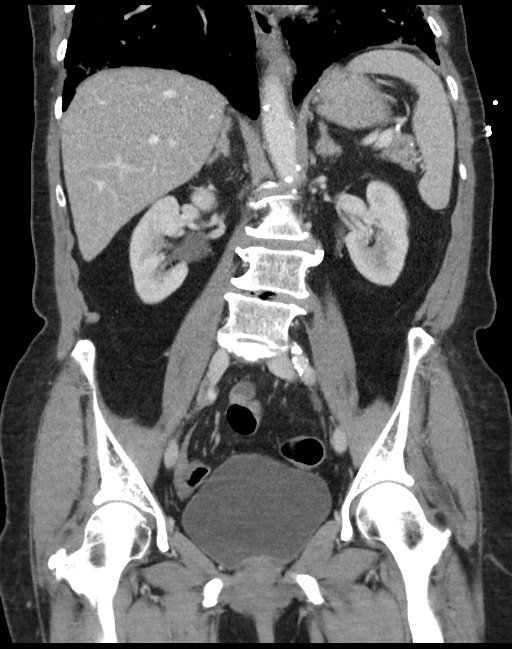

[16 of 46 positions shown; findings below may reference images not displayed]

FINDINGS: Lower chest: There multiple peripheral pulmonary infiltrates at both
lung bases consistent with COVID pneumonia. Heart size is normal.
Small hiatal hernia. Aortic atherosclerosis. Coronary artery
calcifications. No pericardial effusion.

Hepatobiliary: No focal liver abnormality is seen. No gallstones,
gallbladder wall thickening, or biliary dilatation.

Pancreas: Unremarkable. No pancreatic ductal dilatation or
surrounding inflammatory changes.

Spleen: Normal in size without focal abnormality.

Adrenals/Urinary Tract: Adrenal glands are unremarkable. Kidneys are
normal, without renal calculi, focal lesion, or hydronephrosis.
Bladder is unremarkable.

Stomach/Bowel: Small hiatal hernia. The bowel is otherwise normal.
Appendix has been removed.

Vascular/Lymphatic: Extensive aortic atherosclerosis. No adenopathy.

Reproductive: Status post hysterectomy. No adnexal masses.

Other: No abdominal wall hernia or abnormality. No abdominopelvic
ascites.

Musculoskeletal: No acute abnormality. Lumbar scoliosis.
Degenerative disc and joint disease in the lumbar spine.
IMPRESSION: 1. Multiple peripheral pulmonary infiltrates at both lung bases
consistent with COVID pneumonia.
2. No acute abnormalities of the abdomen or pelvis. Small hiatal
hernia.
3. Aortic Atherosclerosis ([id number].O).

## 2022-01-04 DIAGNOSIS — M85852 Other specified disorders of bone density and structure, left thigh: Secondary | ICD-10-CM | POA: Diagnosis not present

## 2022-01-04 DIAGNOSIS — E782 Mixed hyperlipidemia: Secondary | ICD-10-CM | POA: Diagnosis not present

## 2022-01-04 DIAGNOSIS — Z79899 Other long term (current) drug therapy: Secondary | ICD-10-CM | POA: Diagnosis not present

## 2022-01-04 DIAGNOSIS — I10 Essential (primary) hypertension: Secondary | ICD-10-CM | POA: Diagnosis not present

## 2022-01-08 DIAGNOSIS — N1831 Chronic kidney disease, stage 3a: Secondary | ICD-10-CM | POA: Diagnosis not present

## 2022-01-08 DIAGNOSIS — K219 Gastro-esophageal reflux disease without esophagitis: Secondary | ICD-10-CM | POA: Diagnosis not present

## 2022-01-08 DIAGNOSIS — G8929 Other chronic pain: Secondary | ICD-10-CM | POA: Diagnosis not present

## 2022-01-08 DIAGNOSIS — M5451 Vertebrogenic low back pain: Secondary | ICD-10-CM | POA: Diagnosis not present

## 2022-01-08 DIAGNOSIS — E781 Pure hyperglyceridemia: Secondary | ICD-10-CM | POA: Diagnosis not present

## 2022-01-08 DIAGNOSIS — M75101 Unspecified rotator cuff tear or rupture of right shoulder, not specified as traumatic: Secondary | ICD-10-CM | POA: Diagnosis not present

## 2022-01-08 DIAGNOSIS — R69 Illness, unspecified: Secondary | ICD-10-CM | POA: Diagnosis not present

## 2022-01-08 DIAGNOSIS — K580 Irritable bowel syndrome with diarrhea: Secondary | ICD-10-CM | POA: Diagnosis not present

## 2022-01-08 DIAGNOSIS — I10 Essential (primary) hypertension: Secondary | ICD-10-CM | POA: Diagnosis not present

## 2022-01-08 DIAGNOSIS — F411 Generalized anxiety disorder: Secondary | ICD-10-CM | POA: Diagnosis not present

## 2022-01-08 DIAGNOSIS — M85852 Other specified disorders of bone density and structure, left thigh: Secondary | ICD-10-CM | POA: Diagnosis not present

## 2022-01-24 DIAGNOSIS — M810 Age-related osteoporosis without current pathological fracture: Secondary | ICD-10-CM | POA: Diagnosis not present

## 2022-01-24 DIAGNOSIS — M4316 Spondylolisthesis, lumbar region: Secondary | ICD-10-CM | POA: Diagnosis not present

## 2022-01-29 DIAGNOSIS — Z1389 Encounter for screening for other disorder: Secondary | ICD-10-CM | POA: Diagnosis not present

## 2022-01-29 DIAGNOSIS — Z0000 Encounter for general adult medical examination without abnormal findings: Secondary | ICD-10-CM | POA: Diagnosis not present

## 2022-01-30 DIAGNOSIS — M545 Low back pain, unspecified: Secondary | ICD-10-CM | POA: Diagnosis not present

## 2022-01-30 DIAGNOSIS — M4316 Spondylolisthesis, lumbar region: Secondary | ICD-10-CM | POA: Diagnosis not present

## 2022-01-30 DIAGNOSIS — M2578 Osteophyte, vertebrae: Secondary | ICD-10-CM | POA: Diagnosis not present

## 2022-02-20 DIAGNOSIS — M75121 Complete rotator cuff tear or rupture of right shoulder, not specified as traumatic: Secondary | ICD-10-CM | POA: Diagnosis not present

## 2022-02-20 DIAGNOSIS — M25511 Pain in right shoulder: Secondary | ICD-10-CM | POA: Diagnosis not present

## 2022-02-21 DIAGNOSIS — M4316 Spondylolisthesis, lumbar region: Secondary | ICD-10-CM | POA: Diagnosis not present

## 2022-04-15 DIAGNOSIS — M25511 Pain in right shoulder: Secondary | ICD-10-CM | POA: Diagnosis not present

## 2022-04-15 DIAGNOSIS — M19111 Post-traumatic osteoarthritis, right shoulder: Secondary | ICD-10-CM | POA: Diagnosis not present

## 2022-04-15 DIAGNOSIS — M75121 Complete rotator cuff tear or rupture of right shoulder, not specified as traumatic: Secondary | ICD-10-CM | POA: Diagnosis not present

## 2022-04-17 DIAGNOSIS — Z1231 Encounter for screening mammogram for malignant neoplasm of breast: Secondary | ICD-10-CM | POA: Diagnosis not present

## 2022-04-29 DIAGNOSIS — M5136 Other intervertebral disc degeneration, lumbar region: Secondary | ICD-10-CM | POA: Diagnosis not present

## 2022-05-08 DIAGNOSIS — L821 Other seborrheic keratosis: Secondary | ICD-10-CM | POA: Diagnosis not present

## 2022-05-08 DIAGNOSIS — D225 Melanocytic nevi of trunk: Secondary | ICD-10-CM | POA: Diagnosis not present

## 2022-05-08 DIAGNOSIS — L218 Other seborrheic dermatitis: Secondary | ICD-10-CM | POA: Diagnosis not present

## 2022-05-30 NOTE — H&P (Signed)
Patient's anticipated LOS is less than 2 midnights, meeting these requirements: - Younger than 44 - Lives within 1 hour of care - Has a competent adult at home to recover with post-op recover - NO history of  - Chronic pain requiring opiods  - Diabetes  - Coronary Artery Disease  - Heart failure  - Heart attack  - Stroke  - DVT/VTE  - Cardiac arrhythmia  - Respiratory Failure/COPD  - Renal failure  - Anemia  - Advanced Liver disease     Kaitlyn Newton is an 81 y.o. female.    Chief Complaint: right shoulder pain  HPI: Pt is a 81 y.o. female complaining of right shoulder pain for multiple years. Pain had continually increased since the beginning. X-rays in the clinic show end-stage arthritic changes of the right shoulder. Pt has tried various conservative treatments which have failed to alleviate their symptoms, including injections and therapy. Various options are discussed with the patient. Risks, benefits and expectations were discussed with the patient. Patient understand the risks, benefits and expectations and wishes to proceed with surgery.   PCP:  Cari Caraway, MD  D/C Plans: Home  PMH: Past Medical History:  Diagnosis Date   Anxiety    Arthritis    hands   CKD (chronic kidney disease), stage III    no nephrologist   GERD (gastroesophageal reflux disease)    Heart murmur    no cardiologist   High cholesterol    HTN (hypertension)    states under control with meds., has been on med. since 1990s   Mucoid cyst of joint 12/2016   left long finger   PONV (postoperative nausea and vomiting)    after appendectomy with ether    PSH: Past Surgical History:  Procedure Laterality Date   APPENDECTOMY     BILATERAL SALPINGOOPHORECTOMY  03/07/2003   BLADDER REPAIR  03/07/2003   anterior repair   BLADDER SUSPENSION     BLADDER SUSPENSION  03/07/2003   [place] sling   CATARACT EXTRACTION W/ INTRAOCULAR LENS  IMPLANT, BILATERAL Bilateral    DUPUYTREN /  PALMAR FASCIOTOMY Right 03/23/2009   ESOPHAGOGASTRODUODENOSCOPY     I & D EXTREMITY N/A 01/02/2017   Procedure: DEBRIDEMENT DIP JOINT;  Surgeon: Leanora Cover, MD;  Location: [facility];  Service: Orthopedics;  Laterality: N/A;   MASS EXCISION Left 01/02/2017   Procedure: LEFT LONG FINGER EXCISION MASS;  Surgeon: Leanora Cover, MD;  Location: [facility];  Service: Orthopedics;  Laterality: Left;   REFRACTIVE SURGERY Bilateral    TONSILLECTOMY     TRIGGER FINGER RELEASE Right 03/23/2009   index and long fingers   VAGINAL HYSTERECTOMY  03/07/2003    Social History:  reports that she has never smoked. She has never used smokeless tobacco. She reports that she does not drink alcohol and does not use drugs. BMI: Estimated body mass index is 22.67 kg/m as calculated from the following:   Height as of 01/02/17: '5\' 1"'$  (1.549 m).   Weight as of 01/02/17: 54.4 kg.  Lab Results  Component Value Date   ALBUMIN 3.6 11/10/2019   Diabetes: Patient does not have a diagnosis of diabetes.     Smoking Status:   reports that she has never smoked. She has never used smokeless tobacco.    Allergies:  Allergies  Allergen Reactions   Ceclor [Cefaclor] Swelling    SWELLING OF FACE   Macrodantin [Nitrofurantoin] Swelling    SWELLING OF FACE   Vancomycin  Rash    Medications: No current facility-administered medications for this encounter.   Current Outpatient Medications  Medication Sig Dispense Refill   alendronate (FOSAMAX) 70 MG tablet 1 TABLET WEEKLY BY MOUTH 90 DAYS     aspirin EC 81 MG tablet Take 81 mg by mouth daily.     calcium-vitamin D (OSCAL WITH D) 500-200 MG-UNIT tablet Take 1 tablet by mouth 2 (two) times daily.     cholecalciferol (VITAMIN D) 1000 units tablet Take 2,000 Units by mouth daily.     clonazePAM (KLONOPIN) 0.5 MG tablet Take 0.5 mg by mouth at bedtime.      cyclobenzaprine (FLEXERIL) 10 MG tablet Take by mouth.     fenofibrate micronized  (LOFIBRA) 134 MG capsule Take 134 mg by mouth daily.     folic acid (FOLVITE) 115 MCG tablet Take 800 mcg by mouth daily.      glucosamine-chondroitin 500-400 MG tablet Take 1 tablet by mouth 2 (two) times daily.     HYDROcodone-acetaminophen (NORCO) 5-325 MG tablet 1-2 tabs po q6 hours prn pain 20 tablet 0   lisinopril-hydrochlorothiazide (PRINZIDE,ZESTORETIC) 20-12.5 MG tablet Take 2 tablets by mouth daily.     Magnesium 500 MG CAPS Take 500 mg by mouth daily.     metoprolol succinate (TOPROL-XL) 50 MG 24 hr tablet Take 50 mg by mouth daily.     metroNIDAZOLE (FLAGYL) 250 MG tablet Take 250 mg by mouth 3 (three) times daily.     omeprazole (PRILOSEC) 20 MG capsule Take 40 mg by mouth daily.     ondansetron (ZOFRAN ODT) 4 MG disintegrating tablet '4mg'$  ODT q4 hours prn nausea/vomit 10 tablet 0   predniSONE (STERAPRED UNI-PAK 21 TAB) 5 MG (21) TBPK tablet SMARTSIG:1 Pack(s) By Mouth As Directed     rosuvastatin (CRESTOR) 20 MG tablet Take 20 mg by mouth every other day.     traMADol (ULTRAM) 50 MG tablet Take by mouth.     vitamin E 400 UNIT capsule Take 400 Units by mouth daily.      No results found for this or any previous visit (from the past 48 hour(s)). No results found.  ROS: Pain with rom of the right upper extremity  Physical Exam: Alert and oriented 81 y.o. female in no acute distress Cranial nerves 2-12 intact Cervical spine: full rom with no tenderness, nv intact distally Chest: active breath sounds bilaterally, no wheeze rhonchi or rales Heart: regular rate and rhythm, no murmur Abd: non tender non distended with active bowel sounds Hip is stable with rom  Right shoulder painful and weak rom Nv intact distally No rashes or edema  Assessment/Plan Assessment: right shoulder cuff arthropathy  Plan:  Patient will undergo a right reverse total shoulder by Dr. Veverly Fells at [place] Risks benefits and expectations were discussed with the patient. Patient understand risks,  benefits and expectations and wishes to proceed. Preoperative templating of the joint replacement has been completed, documented, and submitted to the Operating Room personnel in order to optimize intra-operative equipment management.   Merla Riches PA-C, MPAS [facility] is now [employer] [address] [place], [place], [place] [address] Phone: [phone] [email or web address] Facebook  [employer]

## 2022-06-06 NOTE — Patient Instructions (Addendum)
SURGICAL WAITING ROOM VISITATION Patients having surgery or a procedure may have no more than 2 support people in the waiting area - these visitors may rotate.   Children under the age of 37 must have an adult with them who is not the patient. If the patient needs to stay at the hospital during part of their recovery, the visitor guidelines for inpatient rooms apply. Pre-op nurse will coordinate an appropriate time for 1 support person to accompany patient in pre-op.  This support person may not rotate.    Please refer to the [facility] website for the visitor guidelines for Inpatients (after your surgery is over and you are in a regular room).    Your procedure is scheduled on: 06/21/22   Report to [facility] Main Entrance    Report to admitting at 7:35 AM   Call this number if you have problems the morning of surgery [phone]   Do not eat food :After Midnight.   After Midnight you may have the following liquids until 7:05 AM DAY OF SURGERY  Water Non-Citrus Juices (without pulp, NO RED) Carbonated Beverages Black Coffee (NO MILK/CREAM OR CREAMERS, sugar ok)  Clear Tea (NO MILK/CREAM OR CREAMERS, sugar ok) regular and decaf                             Plain Jell-O (NO RED)                                           Fruit ices (not with fruit pulp, NO RED)                                     Popsicles (NO RED)                                                               Sports drinks like Gatorade (NO RED)               The day of surgery:  Drink ONE (1) Pre-Surgery Clear Ensure at 7:05 AM the morning of surgery. Drink in one sitting. Do not sip.  This drink was given to you during your hospital  pre-op appointment visit. Nothing else to drink after completing the  Pre-Surgery Clear Ensure.          If you have questions, please contact your surgeon's office.   FOLLOW BOWEL PREP AND ANY ADDITIONAL PRE OP INSTRUCTIONS YOU RECEIVED FROM YOUR SURGEON'S OFFICE!!!      Oral Hygiene is also important to reduce your risk of infection.                                    Remember - BRUSH YOUR TEETH THE MORNING OF SURGERY WITH YOUR REGULAR TOOTHPASTE   Take these medicines the morning of surgery with A SIP OF WATER: Tylenol, Escitalopram, Metoprolol, Rosuvastatin, Tramadol.             You may not have any  metal on your body including hair pins, jewelry, and body piercing             Do not wear make-up, lotions, powders, perfumes, or deodorant  Do not wear nail polish including gel and S&S, artificial/acrylic nails, or any other type of covering on natural nails including finger and toenails. If you have artificial nails, gel coating, etc. that needs to be removed by a nail salon please have this removed prior to surgery or surgery may need to be canceled/ delayed if the surgeon/ anesthesia feels like they are unable to be safely monitored.   Do not shave  48 hours prior to surgery.    Do not bring valuables to the hospital. [place].   Bring small overnight bag day of surgery.   DO NOT [place]. PHARMACY WILL DISPENSE MEDICATIONS LISTED ON YOUR MEDICATION LIST TO YOU DURING YOUR ADMISSION [place]!    Special Instructions: Bring a copy of your healthcare power of attorney and living will documents         the day of surgery if you haven't scanned them before.              Please read over the following fact sheets you were given: IF YOU HAVE QUESTIONS ABOUT YOUR PRE-OP INSTRUCTIONS PLEASE CALL [facility] - Preparing for Surgery Before surgery, you can play an important role.  Because skin is not sterile, your skin needs to be as free of germs as possible.  You can reduce the number of germs on your skin by washing with CHG (chlorahexidine gluconate) soap before surgery.  CHG is an antiseptic cleaner which kills germs and bonds with the skin  to continue killing germs even after washing. Please DO NOT use if you have an allergy to CHG or antibacterial soaps.  If your skin becomes reddened/irritated stop using the CHG and inform your nurse when you arrive at Short Stay. Do not shave (including legs and underarms) for at least 48 hours prior to the first CHG shower.  You may shave your face/neck.  Please follow these instructions carefully:  1.  Shower with CHG Soap the night before surgery and the  morning of surgery.  2.  If you choose to wash your hair, wash your hair first as usual with your normal  shampoo.  3.  After you shampoo, rinse your hair and body thoroughly to remove the shampoo.                             4.  Use CHG as you would any other liquid soap.  You can apply chg directly to the skin and wash.  Gently with a scrungie or clean washcloth.  5.  Apply the CHG Soap to your body ONLY FROM THE NECK DOWN.   Do   not use on face/ open                           Wound or open sores. Avoid contact with eyes, ears mouth and   genitals (private parts).                       Wash face,  Development worker, international aid (private parts)  with your normal soap.             6.  Wash thoroughly, paying special attention to the area where your    surgery  will be performed.  7.  Thoroughly rinse your body with warm water from the neck down.  8.  DO NOT shower/wash with your normal soap after using and rinsing off the CHG Soap.                9.  Pat yourself dry with a clean towel.            10.  Wear clean pajamas.            11.  Place clean sheets on your bed the night of your first shower and do not  sleep with pets. Day of Surgery : Do not apply any lotions/deodorants the morning of surgery.  Please wear clean clothes to the hospital/surgery center.  FAILURE TO FOLLOW THESE INSTRUCTIONS MAY RESULT IN THE CANCELLATION OF YOUR SURGERY  PATIENT SIGNATURE_________________________________  NURSE  SIGNATURE__________________________________  ________________________________________________________________________   Adam Phenix  An incentive spirometer is a tool that can help keep your lungs clear and active. This tool measures how well you are filling your lungs with each breath. Taking long deep breaths may help reverse or decrease the chance of developing breathing (pulmonary) problems (especially infection) following: A long period of time when you are unable to move or be active. BEFORE THE PROCEDURE  If the spirometer includes an indicator to show your best effort, your nurse or respiratory therapist will set it to a desired goal. If possible, sit up straight or lean slightly forward. Try not to slouch. Hold the incentive spirometer in an upright position. INSTRUCTIONS FOR USE  Sit on the edge of your bed if possible, or sit up as far as you can in bed or on a chair. Hold the incentive spirometer in an upright position. Breathe out normally. Place the mouthpiece in your mouth and seal your lips tightly around it. Breathe in slowly and as deeply as possible, raising the piston or the ball toward the top of the column. Hold your breath for 3-5 seconds or for as long as possible. Allow the piston or ball to fall to the bottom of the column. Remove the mouthpiece from your mouth and breathe out normally. Rest for a few seconds and repeat Steps 1 through 7 at least 10 times every 1-2 hours when you are awake. Take your time and take a few normal breaths between deep breaths. The spirometer may include an indicator to show your best effort. Use the indicator as a goal to work toward during each repetition. After each set of 10 deep breaths, practice coughing to be sure your lungs are clear. If you have an incision (the cut made at the time of surgery), support your incision when coughing by placing a pillow or rolled up towels firmly against it. Once you are able to get out of  bed, walk around indoors and cough well. You may stop using the incentive spirometer when instructed by your caregiver.  RISKS AND COMPLICATIONS Take your time so you do not get dizzy or light-headed. If you are in pain, you may need to take or ask for pain medication before doing incentive spirometry. It is harder to take a deep breath if you are having pain. AFTER USE Rest and breathe slowly and easily. It can be helpful to keep track of a log  of your progress. Your caregiver can provide you with a simple table to help with this. If you are using the spirometer at home, follow these instructions: [place] IF:  You are having difficultly using the spirometer. You have trouble using the spirometer as often as instructed. Your pain medication is not giving enough relief while using the spirometer. You develop fever of 100.5 F (38.1 C) or higher. SEEK IMMEDIATE MEDICAL CARE IF:  You cough up bloody sputum that had not been present before. You develop fever of 102 F (38.9 C) or greater. You develop worsening pain at or near the incision site. MAKE SURE YOU:  Understand these instructions. Will watch your condition. Will get help right away if you are not doing well or get worse. Document Released: 02/24/2007 Document Revised: 01/06/2012 Document Reviewed: 04/27/2007 ExitCare Patient Information 2014 [employer].   ________________________________________________________________________  [facility]- Preparing for Total Shoulder Arthroplasty    Before surgery, you can play an important role. Because skin is not sterile, your skin needs to be as free of germs as possible. You can reduce the number of germs on your skin by using the following products. Benzoyl Peroxide Gel Reduces the number of germs present on the skin Applied twice a day to shoulder area starting two days before surgery    ==================================================================  Please  follow these instructions carefully:  BENZOYL PEROXIDE 5% GEL  Please do not use if you have an allergy to benzoyl peroxide.   If your skin becomes reddened/irritated stop using the benzoyl peroxide.  Starting two days before surgery, apply as follows: Apply benzoyl peroxide in the morning and at night. Apply after taking a shower. If you are not taking a shower clean entire shoulder front, back, and side along with the armpit with a clean wet washcloth.  Place a quarter-sized dollop on your shoulder and rub in thoroughly, making sure to cover the front, back, and side of your shoulder, along with the armpit.   2 days before ____ AM   ____ PM              1 day before ____ AM   ____ PM                         Do this twice a day for two days.  (Last application is the night before surgery, AFTER using the CHG soap as described below).  Do NOT apply benzoyl peroxide gel on the day of surgery.

## 2022-06-06 NOTE — Progress Notes (Addendum)
COVID Vaccine Completed: yes x2  Date of COVID positive in last 90 days: no  PCP - Cari Caraway, MD Cardiologist - n/a  Chest x-ray - n/a EKG - 06/10/22 Epic/chart Stress Test - n/a ECHO - n/a Cardiac Cath - n/a Pacemaker/ICD device last checked: n/a Spinal Cord Stimulator: n/a  Bowel Prep - no  Sleep Study - n/a CPAP -   Fasting Blood Sugar - n/a Checks Blood Sugar _____ times a day  Blood Thinner Instructions: n/a Aspirin Instructions: Last Dose:  Activity level: Can go up a flight of stairs and perform activities of daily living without stopping and without symptoms of chest pain or shortness of breath.   Anesthesia review:   Patient denies shortness of breath, fever, cough and chest pain at PAT appointment  Patient verbalized understanding of instructions that were given to them at the PAT appointment. Patient was also instructed that they will need to review over the PAT instructions again at home before surgery.

## 2022-06-10 ENCOUNTER — Encounter (HOSPITAL_COMMUNITY)
Admission: RE | Admit: 2022-06-10 | Discharge: 2022-06-10 | Disposition: A | Discharge status: Home / Self Care | Payer: Medicare HMO | Source: Ambulatory Visit | Attending: Orthopedic Surgery | Admitting: Orthopedic Surgery

## 2022-06-10 ENCOUNTER — Encounter (HOSPITAL_COMMUNITY): Payer: Self-pay

## 2022-06-10 VITALS — BP 148/69 | HR 73 | Temp 98.0°F | Resp 14 | Ht 61.0 in | Wt 136.8 lb

## 2022-06-10 DIAGNOSIS — I2510 Atherosclerotic heart disease of native coronary artery without angina pectoris: Secondary | ICD-10-CM | POA: Diagnosis not present

## 2022-06-10 DIAGNOSIS — Z01818 Encounter for other preprocedural examination: Secondary | ICD-10-CM | POA: Diagnosis not present

## 2022-06-10 LAB — BASIC METABOLIC PANEL
Anion gap: 11 (ref 5–15)
BUN: 16 mg/dL (ref 8–23)
CO2: 24 mmol/L (ref 22–32)
Calcium: 10.2 mg/dL (ref 8.9–10.3)
Chloride: 100 mmol/L (ref 98–111)
Creatinine, Ser: 0.98 mg/dL (ref 0.44–1.00)
GFR, Estimated: 58 mL/min — ABNORMAL LOW (ref >60)
Glucose, Bld: 107 mg/dL — ABNORMAL HIGH (ref 70–99)
Potassium: 3.8 mmol/L (ref 3.5–5.1)
Sodium: 135 mmol/L (ref 135–145)

## 2022-06-10 LAB — CBC
HCT: 37.5 % (ref 36.0–46.0)
Hemoglobin: 12.8 g/dL (ref 12.0–15.0)
MCH: 32.2 pg (ref 26.0–34.0)
MCHC: 34.1 g/dL (ref 30.0–36.0)
MCV: 94.2 fL (ref 80.0–100.0)
Platelets: 287 10*3/uL (ref 150–400)
RBC: 3.98 MIL/uL (ref 3.87–5.11)
RDW: 13.6 % (ref 11.5–15.5)
WBC: 6.3 10*3/uL (ref 4.0–10.5)
nRBC: 0 % (ref 0.0–0.2)

## 2022-06-10 LAB — SURGICAL PCR SCREEN
MRSA, PCR: NEGATIVE
Staphylococcus aureus: POSITIVE — AB

## 2022-06-10 NOTE — Progress Notes (Signed)
[facility] + results routed to Dr. Veverly Fells.

## 2022-06-21 ENCOUNTER — Ambulatory Visit (HOSPITAL_COMMUNITY): Admission: RE | Admit: 2022-06-21 | Payer: Medicare HMO | Source: Home / Self Care | Admitting: Orthopedic Surgery

## 2022-06-21 ENCOUNTER — Encounter (HOSPITAL_COMMUNITY): Admission: RE | Payer: Self-pay | Source: Home / Self Care

## 2022-06-21 SURGERY — ARTHROPLASTY, SHOULDER, TOTAL, REVERSE
Anesthesia: General | Site: Shoulder | Laterality: Right

## 2022-07-02 NOTE — Progress Notes (Signed)
Sent message, via epic in basket, requesting orders in epic from surgeon.  

## 2022-07-03 DIAGNOSIS — M25511 Pain in right shoulder: Secondary | ICD-10-CM | POA: Diagnosis not present

## 2022-07-04 NOTE — H&P (Signed)
Patient's anticipated LOS is less than 2 midnights, meeting these requirements: - Younger than 60 - Lives within 1 hour of care - Has a competent adult at home to recover with post-op recover - NO history of  - Chronic pain requiring opiods  - Diabetes  - Coronary Artery Disease  - Heart failure  - Heart attack  - Stroke  - DVT/VTE  - Cardiac arrhythmia  - Respiratory Failure/COPD  - Renal failure  - Anemia  - Advanced Liver disease     Kaitlyn Newton is an 81 y.o. female.    Chief Complaint: right shoulder pain  HPI: Pt is a 81 y.o. female complaining of right shoulder pain for multiple years. Pain had continually increased since the beginning. X-rays in the clinic show end-stage arthritic changes of the right shoulder. Pt has tried various conservative treatments which have failed to alleviate their symptoms, including injections and therapy. Various options are discussed with the patient. Risks, benefits and expectations were discussed with the patient. Patient understand the risks, benefits and expectations and wishes to proceed with surgery.   PCP:  Cari Caraway, MD  D/C Plans: Home  PMH: Past Medical History:  Diagnosis Date   Anxiety    Arthritis    hands   CKD (chronic kidney disease), stage III ([place])    no nephrologist   GERD (gastroesophageal reflux disease)    Heart murmur    no cardiologist   High cholesterol    HTN (hypertension)    states under control with meds., has been on med. since 1990s   Mucoid cyst of joint 12/2016   left long finger   PONV (postoperative nausea and vomiting)    after appendectomy with ether    PSH: Past Surgical History:  Procedure Laterality Date   APPENDECTOMY     BILATERAL SALPINGOOPHORECTOMY  03/07/2003   BLADDER REPAIR  03/07/2003   anterior repair   BLADDER SUSPENSION     BLADDER SUSPENSION  03/07/2003   [place] sling   CATARACT EXTRACTION W/ INTRAOCULAR LENS  IMPLANT, BILATERAL Bilateral     DUPUYTREN / PALMAR FASCIOTOMY Right 03/23/2009   ESOPHAGOGASTRODUODENOSCOPY     I & D EXTREMITY N/A 01/02/2017   Procedure: DEBRIDEMENT DIP JOINT;  Surgeon: Leanora Cover, MD;  Location: [facility];  Service: Orthopedics;  Laterality: N/A;   MASS EXCISION Left 01/02/2017   Procedure: LEFT LONG FINGER EXCISION MASS;  Surgeon: Leanora Cover, MD;  Location: [facility];  Service: Orthopedics;  Laterality: Left;   REFRACTIVE SURGERY Bilateral    TONSILLECTOMY     TRIGGER FINGER RELEASE Right 03/23/2009   index and long fingers   VAGINAL HYSTERECTOMY  03/07/2003   WISDOM TOOTH EXTRACTION      Social History:  reports that she has never smoked. She has never used smokeless tobacco. She reports that she does not drink alcohol and does not use drugs. BMI: Estimated body mass index is 25.85 kg/m as calculated from the following:   Height as of 06/10/22: '5\' 1"'$  (1.549 m).   Weight as of 06/10/22: 62.1 kg.  Lab Results  Component Value Date   ALBUMIN 3.6 11/10/2019   Diabetes: Patient does not have a diagnosis of diabetes.     Smoking Status:   reports that she has never smoked. She has never used smokeless tobacco.    Allergies:  Allergies  Allergen Reactions   Ceclor [Cefaclor] Swelling    SWELLING OF FACE   Macrodantin [Nitrofurantoin] Swelling  SWELLING OF FACE   Vancomycin Rash    Medications: No current facility-administered medications for this encounter.   Current Outpatient Medications  Medication Sig Dispense Refill   acetaminophen (TYLENOL) 650 MG CR tablet Take 1,300 mg by mouth in the morning and at bedtime.     amLODipine (NORVASC) 10 MG tablet Take 10 mg by mouth at bedtime.     Ascorbic Acid (VITAMIN C) 1000 MG tablet Take 1,000 mg by mouth daily.     Calcium Carbonate-Vitamin D 600-10 MG-MCG TABS Take 1 tablet by mouth 2 (two) times daily.     Cholecalciferol (VITAMIN D) 50 MCG (2000 UT) CAPS Take 2,000 Units by mouth daily.      colestipol (COLESTID) 1 g tablet Take 2 g by mouth daily. 1 hour after breakfast     Cyanocobalamin (B-12) 500 MCG SUBL Place 500 mcg under the tongue every morning.     diclofenac Sodium (VOLTAREN) 1 % GEL Apply 1 Application topically in the morning and at bedtime.     escitalopram (LEXAPRO) 20 MG tablet Take 20 mg by mouth daily.     esomeprazole (NEXIUM) 40 MG capsule Take 40 mg by mouth every evening.     fenofibrate micronized (LOFIBRA) 134 MG capsule Take 134 mg by mouth at bedtime.     Glucosamine-Chondroitin 750-600 MG TABS Take 1 tablet by mouth 2 (two) times daily.     lisinopril-hydrochlorothiazide (PRINZIDE,ZESTORETIC) 20-12.5 MG tablet Take 2 tablets by mouth daily.     metoprolol succinate (TOPROL-XL) 100 MG 24 hr tablet Take 100 mg by mouth daily.     rosuvastatin (CRESTOR) 20 MG tablet Take 20 mg by mouth every other day.     traMADol (ULTRAM) 50 MG tablet Take 50 mg by mouth 2 (two) times daily.      No results found for this or any previous visit (from the past 48 hour(s)). No results found.  ROS: Pain with rom of the right upper extremity  Physical Exam: Alert and oriented 81 y.o. female in no acute distress Cranial nerves 2-12 intact Cervical spine: full rom with no tenderness, nv intact distally Chest: active breath sounds bilaterally, no wheeze rhonchi or rales Heart: regular rate and rhythm, no murmur Abd: non tender non distended with active bowel sounds Hip is stable with rom  Right shoulder painful and weak rom Crepitus with rom Nv intact distally   Assessment/Plan Assessment: right shoulder cuff arthropathy  Plan:  Patient will undergo a right reverse total shoulder by Dr. Veverly Fells at [facility] Risks benefits and expectations were discussed with the patient. Patient understand risks, benefits and expectations and wishes to proceed. Preoperative templating of the joint replacement has been completed, documented, and submitted to the Operating Room personnel  in order to optimize intra-operative equipment management.   Merla Riches PA-C, MPAS [facility] is now [employer] [address] [place], [place], [place] [address] Phone: [phone] [email or web address] Facebook  [employer]

## 2022-07-05 DIAGNOSIS — I10 Essential (primary) hypertension: Secondary | ICD-10-CM | POA: Diagnosis not present

## 2022-07-08 NOTE — Progress Notes (Signed)
COVID Vaccine Completed: yes x2  Date of COVID positive in last 90 days:  PCP - Cari Caraway, MD Cardiologist -   Chest x-ray -  EKG - 06/10/22 Epic Stress Test -  ECHO -  Cardiac Cath -  Pacemaker/ICD device last checked: Spinal Cord Stimulator:  Bowel Prep -   Sleep Study -  CPAP -   Fasting Blood Sugar -  Checks Blood Sugar _____ times a day  Blood Thinner Instructions: Aspirin Instructions: Last Dose:  Activity level:  Can go up a flight of stairs and perform activities of daily living without stopping and without symptoms of chest pain or shortness of breath.  Able to exercise without symptoms  Unable to go up a flight of stairs without symptoms of     Anesthesia review:   Patient denies shortness of breath, fever, cough and chest pain at PAT appointment  Patient verbalized understanding of instructions that were given to them at the PAT appointment. Patient was also instructed that they will need to review over the PAT instructions again at home before surgery.

## 2022-07-08 NOTE — Patient Instructions (Signed)
SURGICAL WAITING ROOM VISITATION Patients having surgery or a procedure may have no more than 2 support people in the waiting area - these visitors may rotate.   Children under the age of 25 must have an adult with them who is not the patient. If the patient needs to stay at the hospital during part of their recovery, the visitor guidelines for inpatient rooms apply. Pre-op nurse will coordinate an appropriate time for 1 support person to accompany patient in pre-op.  This support person may not rotate.    Please refer to the [facility] website for the visitor guidelines for Inpatients (after your surgery is over and you are in a regular room).    Your procedure is scheduled on: 07/26/22   Report to [facility] Main Entrance    Report to admitting at 10:00 AM   Call this number if you have problems the morning of surgery [phone]   Do not eat food :After Midnight.   After Midnight you may have the following liquids until 9:30 AM DAY OF SURGERY  Water Non-Citrus Juices (without pulp, NO RED) Carbonated Beverages Black Coffee (NO MILK/CREAM OR CREAMERS, sugar ok)  Clear Tea (NO MILK/CREAM OR CREAMERS, sugar ok) regular and decaf                             Plain Jell-O (NO RED)                                           Fruit ices (not with fruit pulp, NO RED)                                     Popsicles (NO RED)                                                               Sports drinks like Gatorade (NO RED)                 The day of surgery:  Drink ONE (1) Pre-Surgery Clear Ensure at 9:30 AM the morning of surgery. Drink in one sitting. Do not sip.  This drink was given to you during your hospital  pre-op appointment visit. Nothing else to drink after completing the  Pre-Surgery Clear Ensure.          If you have questions, please contact your surgeon's office.   FOLLOW BOWEL PREP AND ANY ADDITIONAL PRE OP INSTRUCTIONS YOU RECEIVED FROM YOUR SURGEON'S  OFFICE!!!     Oral Hygiene is also important to reduce your risk of infection.                                    Remember - BRUSH YOUR TEETH THE MORNING OF SURGERY WITH YOUR REGULAR TOOTHPASTE   Take these medicines the morning of surgery with A SIP OF WATER: Lexapro, Metoprolol, Rosuvastatin, Tramadol  You may not have any metal on your body including hair pins, jewelry, and body piercing             Do not wear make-up, lotions, powders, perfumes, or deodorant  Do not wear nail polish including gel and S&S, artificial/acrylic nails, or any other type of covering on natural nails including finger and toenails. If you have artificial nails, gel coating, etc. that needs to be removed by a nail salon please have this removed prior to surgery or surgery may need to be canceled/ delayed if the surgeon/ anesthesia feels like they are unable to be safely monitored.   Do not shave  48 hours prior to surgery.    Do not bring valuables to the hospital. [place].   Bring small overnight bag day of surgery.   DO NOT Normal. PHARMACY WILL DISPENSE MEDICATIONS LISTED ON YOUR MEDICATION LIST TO YOU DURING YOUR ADMISSION [place]!               Please read over the following fact sheets you were given: IF YOU HAVE QUESTIONS ABOUT YOUR PRE-OP INSTRUCTIONS PLEASE CALL [facility] - Preparing for Surgery Before surgery, you can play an important role.  Because skin is not sterile, your skin needs to be as free of germs as possible.  You can reduce the number of germs on your skin by washing with CHG (chlorahexidine gluconate) soap before surgery.  CHG is an antiseptic cleaner which kills germs and bonds with the skin to continue killing germs even after washing. Please DO NOT use if you have an allergy to CHG or antibacterial soaps.  If your skin becomes  reddened/irritated stop using the CHG and inform your nurse when you arrive at Short Stay. Do not shave (including legs and underarms) for at least 48 hours prior to the first CHG shower.  You may shave your face/neck.  Please follow these instructions carefully:  1.  Shower with CHG Soap the night before surgery and the  morning of surgery.  2.  If you choose to wash your hair, wash your hair first as usual with your normal  shampoo.  3.  After you shampoo, rinse your hair and body thoroughly to remove the shampoo.                             4.  Use CHG as you would any other liquid soap.  You can apply chg directly to the skin and wash.  Gently with a scrungie or clean washcloth.  5.  Apply the CHG Soap to your body ONLY FROM THE NECK DOWN.   Do   not use on face/ open                           Wound or open sores. Avoid contact with eyes, ears mouth and   genitals (private parts).                       Wash face,  Genitals (private parts) with your normal soap.             6.  Wash thoroughly, paying special attention to the area where your  surgery  will be performed.  7.  Thoroughly rinse your body with warm water from the neck down.  8.  DO NOT shower/wash with your normal soap after using and rinsing off the CHG Soap.                9.  Pat yourself dry with a clean towel.            10.  Wear clean pajamas.            11.  Place clean sheets on your bed the night of your first shower and do not  sleep with pets. Day of Surgery : Do not apply any lotions/deodorants the morning of surgery.  Please wear clean clothes to the hospital/surgery center.  FAILURE TO FOLLOW THESE INSTRUCTIONS MAY RESULT IN THE CANCELLATION OF YOUR SURGERY  PATIENT SIGNATURE_________________________________  NURSE SIGNATURE__________________________________  ________________________________________________________________________   Kaitlyn Newton  An incentive spirometer is a tool that can help  keep your lungs clear and active. This tool measures how well you are filling your lungs with each breath. Taking long deep breaths may help reverse or decrease the chance of developing breathing (pulmonary) problems (especially infection) following: A long period of time when you are unable to move or be active. BEFORE THE PROCEDURE  If the spirometer includes an indicator to show your best effort, your nurse or respiratory therapist will set it to a desired goal. If possible, sit up straight or lean slightly forward. Try not to slouch. Hold the incentive spirometer in an upright position. INSTRUCTIONS FOR USE  Sit on the edge of your bed if possible, or sit up as far as you can in bed or on a chair. Hold the incentive spirometer in an upright position. Breathe out normally. Place the mouthpiece in your mouth and seal your lips tightly around it. Breathe in slowly and as deeply as possible, raising the piston or the ball toward the top of the column. Hold your breath for 3-5 seconds or for as long as possible. Allow the piston or ball to fall to the bottom of the column. Remove the mouthpiece from your mouth and breathe out normally. Rest for a few seconds and repeat Steps 1 through 7 at least 10 times every 1-2 hours when you are awake. Take your time and take a few normal breaths between deep breaths. The spirometer may include an indicator to show your best effort. Use the indicator as a goal to work toward during each repetition. After each set of 10 deep breaths, practice coughing to be sure your lungs are clear. If you have an incision (the cut made at the time of surgery), support your incision when coughing by placing a pillow or rolled up towels firmly against it. Once you are able to get out of bed, walk around indoors and cough well. You may stop using the incentive spirometer when instructed by your caregiver.  RISKS AND COMPLICATIONS Take your time so you do not get dizzy or  light-headed. If you are in pain, you may need to take or ask for pain medication before doing incentive spirometry. It is harder to take a deep breath if you are having pain. AFTER USE Rest and breathe slowly and easily. It can be helpful to keep track of a log of your progress. Your caregiver can provide you with a simple table to help with this. If you are using the spirometer at home, follow these instructions: [place] IF:  You are having difficultly using the spirometer. You have trouble using the spirometer as often as instructed. Your pain medication is not giving enough relief while using the spirometer. You develop fever of 100.5 F (38.1 C) or higher. SEEK IMMEDIATE MEDICAL CARE IF:  You cough up bloody sputum that had not been present before. You develop fever of 102 F (38.9 C) or greater. You develop worsening pain at or near the incision site. MAKE SURE YOU:  Understand these instructions. Will watch your condition. Will get help right away if you are not doing well or get worse. Document Released: 02/24/2007 Document Revised: 01/06/2012 Document Reviewed: 04/27/2007 ExitCare Patient Information 2014 [employer].   ________________________________________________________________________  [facility]- Preparing for Total Shoulder Arthroplasty    Before surgery, you can play an important role. Because skin is not sterile, your skin needs to be as free of germs as possible. You can reduce the number of germs on your skin by using the following products. Benzoyl Peroxide Gel Reduces the number of germs present on the skin Applied twice a day to shoulder area starting two days before surgery    ==================================================================  Please follow these instructions carefully:  BENZOYL PEROXIDE 5% GEL  Please do not use if you have an allergy to benzoyl peroxide.   If your skin becomes reddened/irritated stop using the benzoyl  peroxide.  Starting two days before surgery, apply as follows: Apply benzoyl peroxide in the morning and at night. Apply after taking a shower. If you are not taking a shower clean entire shoulder front, back, and side along with the armpit with a clean wet washcloth.  Place a quarter-sized dollop on your shoulder and rub in thoroughly, making sure to cover the front, back, and side of your shoulder, along with the armpit.   2 days before ____ AM   ____ PM              1 day before ____ AM   ____ PM                         Do this twice a day for two days.  (Last application is the night before surgery, AFTER using the CHG soap as described below).  Do NOT apply benzoyl peroxide gel on the day of surgery.

## 2022-07-09 ENCOUNTER — Encounter (HOSPITAL_COMMUNITY)
Admission: RE | Admit: 2022-07-09 | Discharge: 2022-07-09 | Disposition: A | Discharge status: Home / Self Care | Payer: Medicare HMO | Source: Ambulatory Visit | Attending: Orthopedic Surgery | Admitting: Orthopedic Surgery

## 2022-07-09 ENCOUNTER — Encounter (HOSPITAL_COMMUNITY): Payer: Self-pay

## 2022-07-09 VITALS — BP 153/74 | HR 71 | Temp 98.3°F | Resp 14 | Ht 61.0 in | Wt 136.0 lb

## 2022-07-09 DIAGNOSIS — Z01812 Encounter for preprocedural laboratory examination: Secondary | ICD-10-CM | POA: Insufficient documentation

## 2022-07-09 DIAGNOSIS — I2510 Atherosclerotic heart disease of native coronary artery without angina pectoris: Secondary | ICD-10-CM | POA: Insufficient documentation

## 2022-07-09 DIAGNOSIS — Z01818 Encounter for other preprocedural examination: Secondary | ICD-10-CM

## 2022-07-09 LAB — BASIC METABOLIC PANEL
Anion gap: 10 (ref 5–15)
BUN: 21 mg/dL (ref 8–23)
CO2: 24 mmol/L (ref 22–32)
Calcium: 10.5 mg/dL — ABNORMAL HIGH (ref 8.9–10.3)
Chloride: 99 mmol/L (ref 98–111)
Creatinine, Ser: 1.03 mg/dL — ABNORMAL HIGH (ref 0.44–1.00)
GFR, Estimated: 54 mL/min — ABNORMAL LOW (ref >60)
Glucose, Bld: 104 mg/dL — ABNORMAL HIGH (ref 70–99)
Potassium: 4.6 mmol/L (ref 3.5–5.1)
Sodium: 133 mmol/L — ABNORMAL LOW (ref 135–145)

## 2022-07-09 LAB — CBC
HCT: 37.6 % (ref 36.0–46.0)
Hemoglobin: 13 g/dL (ref 12.0–15.0)
MCH: 33.2 pg (ref 26.0–34.0)
MCHC: 34.6 g/dL (ref 30.0–36.0)
MCV: 95.9 fL (ref 80.0–100.0)
Platelets: 271 10*3/uL (ref 150–400)
RBC: 3.92 MIL/uL (ref 3.87–5.11)
RDW: 13.2 % (ref 11.5–15.5)
WBC: 6.7 10*3/uL (ref 4.0–10.5)
nRBC: 0 % (ref 0.0–0.2)

## 2022-07-09 LAB — SURGICAL PCR SCREEN
MRSA, PCR: NEGATIVE
Staphylococcus aureus: POSITIVE — AB

## 2022-07-09 NOTE — Progress Notes (Signed)
[facility] results routed to Dr. Veverly Fells

## 2022-07-10 DIAGNOSIS — E782 Mixed hyperlipidemia: Secondary | ICD-10-CM | POA: Diagnosis not present

## 2022-07-10 DIAGNOSIS — M5451 Vertebrogenic low back pain: Secondary | ICD-10-CM | POA: Diagnosis not present

## 2022-07-10 DIAGNOSIS — M75101 Unspecified rotator cuff tear or rupture of right shoulder, not specified as traumatic: Secondary | ICD-10-CM | POA: Diagnosis not present

## 2022-07-10 DIAGNOSIS — I872 Venous insufficiency (chronic) (peripheral): Secondary | ICD-10-CM | POA: Diagnosis not present

## 2022-07-10 DIAGNOSIS — M85852 Other specified disorders of bone density and structure, left thigh: Secondary | ICD-10-CM | POA: Diagnosis not present

## 2022-07-10 DIAGNOSIS — I10 Essential (primary) hypertension: Secondary | ICD-10-CM | POA: Diagnosis not present

## 2022-07-10 DIAGNOSIS — K219 Gastro-esophageal reflux disease without esophagitis: Secondary | ICD-10-CM | POA: Diagnosis not present

## 2022-07-10 DIAGNOSIS — Z6827 Body mass index (BMI) 27.0-27.9, adult: Secondary | ICD-10-CM | POA: Diagnosis not present

## 2022-07-10 DIAGNOSIS — E538 Deficiency of other specified B group vitamins: Secondary | ICD-10-CM | POA: Diagnosis not present

## 2022-07-10 DIAGNOSIS — Z23 Encounter for immunization: Secondary | ICD-10-CM | POA: Diagnosis not present

## 2022-07-10 DIAGNOSIS — R69 Illness, unspecified: Secondary | ICD-10-CM | POA: Diagnosis not present

## 2022-07-11 DIAGNOSIS — M25511 Pain in right shoulder: Secondary | ICD-10-CM | POA: Diagnosis not present

## 2022-07-15 DIAGNOSIS — K580 Irritable bowel syndrome with diarrhea: Secondary | ICD-10-CM | POA: Diagnosis not present

## 2022-07-22 DIAGNOSIS — Z961 Presence of intraocular lens: Secondary | ICD-10-CM | POA: Diagnosis not present

## 2022-07-22 DIAGNOSIS — H40033 Anatomical narrow angle, bilateral: Secondary | ICD-10-CM | POA: Diagnosis not present

## 2022-07-22 DIAGNOSIS — H40053 Ocular hypertension, bilateral: Secondary | ICD-10-CM | POA: Diagnosis not present

## 2022-07-26 ENCOUNTER — Encounter (HOSPITAL_COMMUNITY): Payer: Self-pay | Admitting: Orthopedic Surgery

## 2022-07-26 ENCOUNTER — Ambulatory Visit (HOSPITAL_BASED_OUTPATIENT_CLINIC_OR_DEPARTMENT_OTHER): Payer: Medicare HMO | Admitting: Certified Registered"

## 2022-07-26 ENCOUNTER — Ambulatory Visit (HOSPITAL_COMMUNITY): Payer: Medicare HMO | Admitting: Certified Registered"

## 2022-07-26 ENCOUNTER — Other Ambulatory Visit: Payer: Self-pay

## 2022-07-26 ENCOUNTER — Encounter (HOSPITAL_COMMUNITY)
Admission: RE | Disposition: A | Discharge status: Home / Self Care | Payer: Self-pay | Source: Ambulatory Visit | Attending: Orthopedic Surgery

## 2022-07-26 ENCOUNTER — Observation Stay (HOSPITAL_COMMUNITY): Payer: Medicare HMO

## 2022-07-26 ENCOUNTER — Observation Stay (HOSPITAL_COMMUNITY)
Admission: RE | Admit: 2022-07-26 | Discharge: 2022-07-27 | Disposition: A | Discharge status: Home / Self Care | Payer: Medicare HMO | Source: Ambulatory Visit | Attending: Orthopedic Surgery | Admitting: Orthopedic Surgery

## 2022-07-26 DIAGNOSIS — I10 Essential (primary) hypertension: Secondary | ICD-10-CM | POA: Diagnosis not present

## 2022-07-26 DIAGNOSIS — F419 Anxiety disorder, unspecified: Secondary | ICD-10-CM | POA: Diagnosis not present

## 2022-07-26 DIAGNOSIS — N183 Chronic kidney disease, stage 3 unspecified: Secondary | ICD-10-CM | POA: Diagnosis not present

## 2022-07-26 DIAGNOSIS — M75101 Unspecified rotator cuff tear or rupture of right shoulder, not specified as traumatic: Secondary | ICD-10-CM

## 2022-07-26 DIAGNOSIS — M12811 Other specific arthropathies, not elsewhere classified, right shoulder: Secondary | ICD-10-CM | POA: Diagnosis not present

## 2022-07-26 DIAGNOSIS — M19011 Primary osteoarthritis, right shoulder: Secondary | ICD-10-CM | POA: Diagnosis not present

## 2022-07-26 DIAGNOSIS — Z96611 Presence of right artificial shoulder joint: Secondary | ICD-10-CM

## 2022-07-26 DIAGNOSIS — Z79899 Other long term (current) drug therapy: Secondary | ICD-10-CM | POA: Diagnosis not present

## 2022-07-26 DIAGNOSIS — S46011A Strain of muscle(s) and tendon(s) of the rotator cuff of right shoulder, initial encounter: Secondary | ICD-10-CM | POA: Diagnosis not present

## 2022-07-26 DIAGNOSIS — I129 Hypertensive chronic kidney disease with stage 1 through stage 4 chronic kidney disease, or unspecified chronic kidney disease: Secondary | ICD-10-CM | POA: Diagnosis not present

## 2022-07-26 DIAGNOSIS — G8918 Other acute postprocedural pain: Secondary | ICD-10-CM | POA: Diagnosis not present

## 2022-07-26 DIAGNOSIS — M75121 Complete rotator cuff tear or rupture of right shoulder, not specified as traumatic: Secondary | ICD-10-CM | POA: Diagnosis present

## 2022-07-26 DIAGNOSIS — Z471 Aftercare following joint replacement surgery: Secondary | ICD-10-CM | POA: Diagnosis not present

## 2022-07-26 DIAGNOSIS — R69 Illness, unspecified: Secondary | ICD-10-CM | POA: Diagnosis not present

## 2022-07-26 HISTORY — PX: REVERSE SHOULDER ARTHROPLASTY: SHX5054

## 2022-07-26 SURGERY — ARTHROPLASTY, SHOULDER, TOTAL, REVERSE
Anesthesia: General | Site: Shoulder | Laterality: Right

## 2022-07-26 MED ORDER — PHENYLEPHRINE HCL-NACL 20-0.9 MG/250ML-% IV SOLN
INTRAVENOUS | Status: DC | PRN
  Administered 2022-07-26: 20 ug/min via INTRAVENOUS

## 2022-07-26 MED ORDER — TRAMADOL HCL 50 MG PO TABS
50.0000 mg | ORAL_TABLET | Freq: Two times a day (BID) | ORAL | Status: DC
  Administered 2022-07-26 – 2022-07-27 (×2): 50 mg via ORAL
  Filled 2022-07-26 (×2): qty 1

## 2022-07-26 MED ORDER — BUPIVACAINE LIPOSOME 1.3 % IJ SUSP
INTRAMUSCULAR | Status: DC | PRN
  Administered 2022-07-26: 10 mL via PERINEURAL

## 2022-07-26 MED ORDER — POLYETHYLENE GLYCOL 3350 17 G PO PACK: 17.0000 g | PACK | Freq: Every day | ORAL | Status: DC | PRN

## 2022-07-26 MED ORDER — LISINOPRIL 20 MG PO TABS
40.0000 mg | ORAL_TABLET | Freq: Every day | ORAL | Status: DC
  Administered 2022-07-27: 40 mg via ORAL
  Filled 2022-07-26: qty 2

## 2022-07-26 MED ORDER — CHLORHEXIDINE GLUCONATE 0.12 % MT SOLN
15.0000 mL | Freq: Once | OROMUCOSAL | Status: AC
  Administered 2022-07-26: 15 mL via OROMUCOSAL

## 2022-07-26 MED ORDER — FENTANYL CITRATE (PF) 100 MCG/2ML IJ SOLN
INTRAMUSCULAR | Status: AC
  Filled 2022-07-26: qty 2

## 2022-07-26 MED ORDER — ONDANSETRON HCL 4 MG/2ML IJ SOLN: 4.0000 mg | Freq: Once | INTRAMUSCULAR | Status: DC | PRN

## 2022-07-26 MED ORDER — LACTATED RINGERS IV SOLN: INTRAVENOUS | Status: DC

## 2022-07-26 MED ORDER — MORPHINE SULFATE (PF) 2 MG/ML IV SOLN: 0.5000 mg | INTRAVENOUS | Status: DC | PRN

## 2022-07-26 MED ORDER — METOCLOPRAMIDE HCL 5 MG PO TABS: 5.0000 mg | ORAL_TABLET | Freq: Three times a day (TID) | ORAL | Status: DC | PRN

## 2022-07-26 MED ORDER — BUPIVACAINE-EPINEPHRINE (PF) 0.25% -1:200000 IJ SOLN
INTRAMUSCULAR | Status: AC
  Filled 2022-07-26: qty 30

## 2022-07-26 MED ORDER — DEXAMETHASONE SODIUM PHOSPHATE 10 MG/ML IJ SOLN
INTRAMUSCULAR | Status: AC
  Filled 2022-07-26: qty 1

## 2022-07-26 MED ORDER — BUPIVACAINE-EPINEPHRINE (PF) 0.25% -1:200000 IJ SOLN
INTRAMUSCULAR | Status: DC | PRN
  Administered 2022-07-26: 12 mL

## 2022-07-26 MED ORDER — COLESTIPOL HCL 1 G PO TABS
2.0000 g | ORAL_TABLET | Freq: Every day | ORAL | Status: DC
  Administered 2022-07-27: 2 g via ORAL
  Filled 2022-07-26: qty 2

## 2022-07-26 MED ORDER — CLINDAMYCIN PHOSPHATE 600 MG/50ML IV SOLN
600.0000 mg | Freq: Once | INTRAVENOUS | Status: AC
  Administered 2022-07-26: 600 mg via INTRAVENOUS
  Filled 2022-07-26: qty 50

## 2022-07-26 MED ORDER — ONDANSETRON HCL 4 MG/2ML IJ SOLN: 4.0000 mg | Freq: Four times a day (QID) | INTRAMUSCULAR | Status: DC | PRN

## 2022-07-26 MED ORDER — VITAMIN C 500 MG PO TABS
1000.0000 mg | ORAL_TABLET | Freq: Every day | ORAL | Status: DC
  Administered 2022-07-27: 1000 mg via ORAL
  Filled 2022-07-26: qty 2

## 2022-07-26 MED ORDER — PHENYLEPHRINE 80 MCG/ML (10ML) SYRINGE FOR IV PUSH (FOR BLOOD PRESSURE SUPPORT)
PREFILLED_SYRINGE | INTRAVENOUS | Status: DC | PRN
  Administered 2022-07-26 (×3): 40 ug via INTRAVENOUS

## 2022-07-26 MED ORDER — LISINOPRIL-HYDROCHLOROTHIAZIDE 20-12.5 MG PO TABS: 2.0000 | ORAL_TABLET | Freq: Every day | ORAL | Status: DC

## 2022-07-26 MED ORDER — LIDOCAINE HCL (PF) 2 % IJ SOLN
INTRAMUSCULAR | Status: AC
  Filled 2022-07-26: qty 5

## 2022-07-26 MED ORDER — SODIUM CHLORIDE 0.9 % IV SOLN: INTRAVENOUS | Status: DC

## 2022-07-26 MED ORDER — METOCLOPRAMIDE HCL 5 MG/ML IJ SOLN: 5.0000 mg | Freq: Three times a day (TID) | INTRAMUSCULAR | Status: DC | PRN

## 2022-07-26 MED ORDER — AMLODIPINE BESYLATE 10 MG PO TABS
10.0000 mg | ORAL_TABLET | Freq: Every day | ORAL | Status: DC
  Administered 2022-07-26: 10 mg via ORAL
  Filled 2022-07-26: qty 1

## 2022-07-26 MED ORDER — DEXAMETHASONE SODIUM PHOSPHATE 10 MG/ML IJ SOLN
INTRAMUSCULAR | Status: DC | PRN
  Administered 2022-07-26: 4 mg via INTRAVENOUS

## 2022-07-26 MED ORDER — PANTOPRAZOLE SODIUM 40 MG PO TBEC
40.0000 mg | DELAYED_RELEASE_TABLET | Freq: Every day | ORAL | Status: DC
  Administered 2022-07-27: 40 mg via ORAL
  Filled 2022-07-26: qty 1

## 2022-07-26 MED ORDER — HYDROCODONE-ACETAMINOPHEN 5-325 MG PO TABS: 1.0000 | ORAL_TABLET | Freq: Four times a day (QID) | ORAL | 0 refills | Status: DC | PRN

## 2022-07-26 MED ORDER — PHENOL 1.4 % MT LIQD: 1.0000 | OROMUCOSAL | Status: DC | PRN

## 2022-07-26 MED ORDER — PROPOFOL 10 MG/ML IV BOLUS
INTRAVENOUS | Status: DC | PRN
  Administered 2022-07-26: 40 mg via INTRAVENOUS
  Administered 2022-07-26: 120 mg via INTRAVENOUS

## 2022-07-26 MED ORDER — DICLOFENAC SODIUM 1 % EX GEL: 1.0000 | Freq: Four times a day (QID) | CUTANEOUS | Status: DC

## 2022-07-26 MED ORDER — OYSTER SHELL CALCIUM/D3 500-5 MG-MCG PO TABS
1.0000 | ORAL_TABLET | Freq: Two times a day (BID) | ORAL | Status: DC
  Administered 2022-07-27: 1 via ORAL
  Filled 2022-07-26: qty 1

## 2022-07-26 MED ORDER — FENTANYL CITRATE PF 50 MCG/ML IJ SOSY: 25.0000 ug | PREFILLED_SYRINGE | INTRAMUSCULAR | Status: DC | PRN

## 2022-07-26 MED ORDER — ESCITALOPRAM OXALATE 20 MG PO TABS
20.0000 mg | ORAL_TABLET | Freq: Every day | ORAL | Status: DC
  Administered 2022-07-27: 20 mg via ORAL
  Filled 2022-07-26: qty 1

## 2022-07-26 MED ORDER — FENOFIBRATE 54 MG PO TABS
54.0000 mg | ORAL_TABLET | Freq: Every day | ORAL | Status: DC
  Administered 2022-07-27: 54 mg via ORAL
  Filled 2022-07-26: qty 1

## 2022-07-26 MED ORDER — VITAMIN D 25 MCG (1000 UNIT) PO TABS
2000.0000 [IU] | ORAL_TABLET | Freq: Every day | ORAL | Status: DC
  Administered 2022-07-27: 2000 [IU] via ORAL
  Filled 2022-07-26: qty 2

## 2022-07-26 MED ORDER — SUCCINYLCHOLINE CHLORIDE 200 MG/10ML IV SOSY
PREFILLED_SYRINGE | INTRAVENOUS | Status: AC
  Filled 2022-07-26: qty 10

## 2022-07-26 MED ORDER — LIDOCAINE 2% (20 MG/ML) 5 ML SYRINGE
INTRAMUSCULAR | Status: DC | PRN
  Administered 2022-07-26: 60 mg via INTRAVENOUS

## 2022-07-26 MED ORDER — BISACODYL 10 MG RE SUPP: 10.0000 mg | Freq: Every day | RECTAL | Status: DC | PRN

## 2022-07-26 MED ORDER — ONDANSETRON HCL 4 MG PO TABS: 4.0000 mg | ORAL_TABLET | Freq: Four times a day (QID) | ORAL | Status: DC | PRN

## 2022-07-26 MED ORDER — EPHEDRINE 5 MG/ML INJ
INTRAVENOUS | Status: AC
  Filled 2022-07-26: qty 5

## 2022-07-26 MED ORDER — VANCOMYCIN HCL IN DEXTROSE 1-5 GM/200ML-% IV SOLN: 1000.0000 mg | Freq: Once | INTRAVENOUS | Status: DC

## 2022-07-26 MED ORDER — HYDROCHLOROTHIAZIDE 25 MG PO TABS
25.0000 mg | ORAL_TABLET | Freq: Every day | ORAL | Status: DC
  Administered 2022-07-27: 25 mg via ORAL
  Filled 2022-07-26: qty 1

## 2022-07-26 MED ORDER — BUPIVACAINE HCL (PF) 0.5 % IJ SOLN
INTRAMUSCULAR | Status: DC | PRN
  Administered 2022-07-26: 10 mL via PERINEURAL

## 2022-07-26 MED ORDER — GLUCOSAMINE-CHONDROITIN 750-600 MG PO TABS: 1.0000 | ORAL_TABLET | Freq: Two times a day (BID) | ORAL | Status: DC

## 2022-07-26 MED ORDER — ACETAMINOPHEN 500 MG PO TABS
1000.0000 mg | ORAL_TABLET | Freq: Once | ORAL | Status: DC
  Filled 2022-07-26: qty 2

## 2022-07-26 MED ORDER — CYANOCOBALAMIN 500 MCG PO TABS
500.0000 ug | ORAL_TABLET | Freq: Every day | ORAL | Status: DC
  Administered 2022-07-27: 500 ug via ORAL
  Filled 2022-07-26: qty 1

## 2022-07-26 MED ORDER — ACETAMINOPHEN 325 MG PO TABS: 325.0000 mg | ORAL_TABLET | Freq: Four times a day (QID) | ORAL | Status: DC | PRN

## 2022-07-26 MED ORDER — FENTANYL CITRATE PF 50 MCG/ML IJ SOSY
50.0000 ug | PREFILLED_SYRINGE | INTRAMUSCULAR | Status: DC
  Administered 2022-07-26: 50 ug via INTRAVENOUS
  Filled 2022-07-26: qty 2

## 2022-07-26 MED ORDER — ACETAMINOPHEN ER 650 MG PO TBCR: 1300.0000 mg | EXTENDED_RELEASE_TABLET | Freq: Two times a day (BID) | ORAL | Status: DC | PRN

## 2022-07-26 MED ORDER — ONDANSETRON HCL 4 MG/2ML IJ SOLN
INTRAMUSCULAR | Status: DC | PRN
  Administered 2022-07-26: 4 mg via INTRAVENOUS

## 2022-07-26 MED ORDER — PHENYLEPHRINE 80 MCG/ML (10ML) SYRINGE FOR IV PUSH (FOR BLOOD PRESSURE SUPPORT)
PREFILLED_SYRINGE | INTRAVENOUS | Status: AC
  Filled 2022-07-26: qty 10

## 2022-07-26 MED ORDER — ROSUVASTATIN CALCIUM 20 MG PO TABS
20.0000 mg | ORAL_TABLET | ORAL | Status: DC
  Administered 2022-07-27: 20 mg via ORAL
  Filled 2022-07-26: qty 1

## 2022-07-26 MED ORDER — SUCCINYLCHOLINE CHLORIDE 200 MG/10ML IV SOSY
PREFILLED_SYRINGE | INTRAVENOUS | Status: DC | PRN
  Administered 2022-07-26: 100 mg via INTRAVENOUS

## 2022-07-26 MED ORDER — ONDANSETRON HCL 4 MG/2ML IJ SOLN
INTRAMUSCULAR | Status: AC
  Filled 2022-07-26: qty 2

## 2022-07-26 MED ORDER — CLINDAMYCIN PHOSPHATE 900 MG/50ML IV SOLN
900.0000 mg | INTRAVENOUS | Status: AC
  Administered 2022-07-26: 900 mg via INTRAVENOUS
  Filled 2022-07-26: qty 50

## 2022-07-26 MED ORDER — MENTHOL 3 MG MT LOZG
1.0000 | LOZENGE | OROMUCOSAL | Status: DC | PRN
  Administered 2022-07-26: 3 mg via ORAL
  Filled 2022-07-26: qty 9

## 2022-07-26 MED ORDER — ORAL CARE MOUTH RINSE: 15.0000 mL | Freq: Once | OROMUCOSAL | Status: AC

## 2022-07-26 MED ORDER — METOPROLOL SUCCINATE ER 50 MG PO TB24
100.0000 mg | ORAL_TABLET | Freq: Every day | ORAL | Status: DC
  Administered 2022-07-27: 100 mg via ORAL
  Filled 2022-07-26: qty 2

## 2022-07-26 MED ORDER — VANCOMYCIN HCL IN DEXTROSE 1-5 GM/200ML-% IV SOLN: 1000.0000 mg | INTRAVENOUS | Status: DC

## 2022-07-26 MED ORDER — SODIUM CHLORIDE 0.9 % IR SOLN
Status: DC | PRN
  Administered 2022-07-26: 1000 mL

## 2022-07-26 MED ORDER — FENTANYL CITRATE (PF) 250 MCG/5ML IJ SOLN
INTRAMUSCULAR | Status: DC | PRN
  Administered 2022-07-26 (×2): 25 ug via INTRAVENOUS

## 2022-07-26 MED ORDER — LORATADINE 10 MG PO TABS
10.0000 mg | ORAL_TABLET | Freq: Every evening | ORAL | Status: DC
  Administered 2022-07-26: 10 mg via ORAL
  Filled 2022-07-26: qty 1

## 2022-07-26 MED ORDER — DOCUSATE SODIUM 100 MG PO CAPS
100.0000 mg | ORAL_CAPSULE | Freq: Two times a day (BID) | ORAL | Status: DC
  Administered 2022-07-27: 100 mg via ORAL
  Filled 2022-07-26 (×2): qty 1

## 2022-07-26 MED ORDER — HYDROCODONE-ACETAMINOPHEN 5-325 MG PO TABS
1.0000 | ORAL_TABLET | ORAL | Status: DC | PRN
  Administered 2022-07-27: 1 via ORAL
  Filled 2022-07-26: qty 1

## 2022-07-26 MED ORDER — PROPOFOL 10 MG/ML IV BOLUS
INTRAVENOUS | Status: AC
  Filled 2022-07-26: qty 20

## 2022-07-26 SURGICAL SUPPLY — 69 items
BAG COUNTER SPONGE SURGICOUNT (BAG) IMPLANT
BAG ZIPLOCK 12X15 (MISCELLANEOUS) IMPLANT
BIT DRILL 1.6MX128 (BIT) IMPLANT
BIT DRILL 170X2.5X (BIT) IMPLANT
BIT DRL 170X2.5X (BIT) ×1
BLADE SAG 18X100X1.27 (BLADE) ×1 IMPLANT
COVER BACK TABLE 60X90IN (DRAPES) ×1 IMPLANT
COVER SURGICAL LIGHT HANDLE (MISCELLANEOUS) ×1 IMPLANT
DRAPE INCISE IOBAN 66X45 STRL (DRAPES) ×1 IMPLANT
DRAPE ORTHO SPLIT 77X108 STRL (DRAPES) ×2
DRAPE SHEET LG 3/4 BI-LAMINATE (DRAPES) ×1 IMPLANT
DRAPE SURG ORHT 6 SPLT 77X108 (DRAPES) ×2 IMPLANT
DRAPE TOP 10253 STERILE (DRAPES) ×1 IMPLANT
DRAPE U-SHAPE 47X51 STRL (DRAPES) ×1 IMPLANT
DRILL 2.5 (BIT) ×1
DRSG ADAPTIC 3X8 NADH LF (GAUZE/BANDAGES/DRESSINGS) ×1 IMPLANT
DURAPREP 26ML APPLICATOR (WOUND CARE) ×1 IMPLANT
ECCENTRIC EPIPHYSI MODULAR SZ1 (Trauma) IMPLANT
ELECT BLADE TIP CTD 4 INCH (ELECTRODE) ×1 IMPLANT
ELECT NDL TIP 2.8 STRL (NEEDLE) ×1 IMPLANT
ELECT NEEDLE TIP 2.8 STRL (NEEDLE) ×1 IMPLANT
ELECT REM PT RETURN 15FT ADLT (MISCELLANEOUS) ×1 IMPLANT
FACESHIELD WRAPAROUND (MASK) ×1 IMPLANT
FACESHIELD WRAPAROUND OR TEAM (MASK) ×1 IMPLANT
GAUZE PAD ABD 8X10 STRL (GAUZE/BANDAGES/DRESSINGS) ×1 IMPLANT
GAUZE SPONGE 4X4 12PLY STRL (GAUZE/BANDAGES/DRESSINGS) ×1 IMPLANT
GLENOSPHERE DELTA XTEND LAT 38 (Miscellaneous) IMPLANT
GLOVE BIOGEL PI IND STRL 7.5 (GLOVE) ×1 IMPLANT
GLOVE BIOGEL PI IND STRL 8.5 (GLOVE) ×1 IMPLANT
GLOVE ORTHO TXT STRL SZ7.5 (GLOVE) ×1 IMPLANT
GLOVE SURG ORTHO 8.5 STRL (GLOVE) ×1 IMPLANT
GOWN STRL REUS W/ TWL XL LVL3 (GOWN DISPOSABLE) ×2 IMPLANT
GOWN STRL REUS W/TWL XL LVL3 (GOWN DISPOSABLE) ×2
KIT BASIN OR (CUSTOM PROCEDURE TRAY) ×1 IMPLANT
KIT TURNOVER KIT A (KITS) IMPLANT
MANIFOLD NEPTUNE II (INSTRUMENTS) ×1 IMPLANT
METAGLENE DELTA EXTEND (Trauma) IMPLANT
METAGLENE DXTEND (Trauma) ×1 IMPLANT
MODULAR ECCENTRIC EPIPHYSI SZ1 (Trauma) ×1 IMPLANT
NDL MAYO CATGUT SZ4 TPR NDL (NEEDLE) ×1 IMPLANT
NEEDLE MAYO CATGUT SZ4 (NEEDLE) ×1 IMPLANT
NS IRRIG 1000ML POUR BTL (IV SOLUTION) ×1 IMPLANT
PACK SHOULDER (CUSTOM PROCEDURE TRAY) ×1 IMPLANT
PIN GUIDE 1.2 (PIN) IMPLANT
PIN GUIDE GLENOPHERE 1.5MX300M (PIN) IMPLANT
PIN METAGLENE 2.5 (PIN) IMPLANT
PROTECTOR NERVE ULNAR (MISCELLANEOUS) ×1 IMPLANT
RESTRAINT HEAD UNIVERSAL NS (MISCELLANEOUS) ×1 IMPLANT
SCREW 4.5X36MM (Screw) IMPLANT
SCREW LOCK DELTA XTEND 4.5X30 (Screw) IMPLANT
SLING ARM FOAM STRAP LRG (SOFTGOODS) IMPLANT
SMARTMIX MINI TOWER (MISCELLANEOUS)
SPACER 38 PLUS 3 (Spacer) IMPLANT
SPIKE FLUID TRANSFER (MISCELLANEOUS) ×1 IMPLANT
SPONGE T-LAP 4X18 ~~LOC~~+RFID (SPONGE) ×1 IMPLANT
STEM HUMERAL SZ8 STANDARD (Stem) ×1 IMPLANT
STEM HUMERAL SZ8 STD (Stem) IMPLANT
STRIP CLOSURE SKIN 1/2X4 (GAUZE/BANDAGES/DRESSINGS) ×1 IMPLANT
SUCTION FRAZIER HANDLE 10FR (MISCELLANEOUS) ×1
SUCTION TUBE FRAZIER 10FR DISP (MISCELLANEOUS) ×1 IMPLANT
SUT FIBERWIRE #2 38 T-5 BLUE (SUTURE) ×2
SUT MNCRL AB 4-0 PS2 18 (SUTURE) ×1 IMPLANT
SUT VIC AB 0 CT1 36 (SUTURE) ×2 IMPLANT
SUT VIC AB 0 CT2 27 (SUTURE) ×1 IMPLANT
SUT VIC AB 2-0 CT1 27 (SUTURE) ×1
SUT VIC AB 2-0 CT1 TAPERPNT 27 (SUTURE) ×1 IMPLANT
SUTURE FIBERWR #2 38 T-5 BLUE (SUTURE) ×2 IMPLANT
TOWEL OR 17X26 10 PK STRL BLUE (TOWEL DISPOSABLE) ×1 IMPLANT
TOWER SMARTMIX MINI (MISCELLANEOUS) IMPLANT

## 2022-07-26 NOTE — Anesthesia Procedure Notes (Signed)
Anesthesia Regional Block: Interscalene brachial plexus block   Pre-Anesthetic Checklist: , timeout performed,  Correct Patient, Correct Site, Correct Laterality,  Correct Procedure, Correct Position, site marked,  Risks and benefits discussed,  Surgical consent,  Pre-op evaluation,  At surgeon's request and post-op pain management  Laterality: Right  Prep: chloraprep       Needles:  Injection technique: Single-shot  Needle Type: Echogenic Needle     Needle Length: 9cm  Needle Gauge: 21     Additional Needles:   Procedures:,,,, ultrasound used (permanent image in chart),,    Narrative:  Start time: 07/26/2022 11:03 AM End time: 07/26/2022 11:10 AM Injection made incrementally with aspirations every 5 mL.  Performed by: Personally  Anesthesiologist: Santa Lighter, MD  Additional Notes: No pain on injection. No increased resistance to injection. Injection made in 5cc increments.  Good needle visualization.  Patient tolerated procedure well.

## 2022-07-26 NOTE — Transfer of Care (Signed)
Immediate Anesthesia Transfer of Care Note  Patient: Kaitlyn Newton  Procedure(s) Performed: REVERSE SHOULDER ARTHROPLASTY (Right: Shoulder)  Patient Location: PACU  Anesthesia Type:General  Level of Consciousness: awake, drowsy and patient cooperative  Airway & Oxygen Therapy: Patient Spontanous Breathing and Patient connected to face mask oxygen  Post-op Assessment: Report given to RN and Post -op Vital signs reviewed and stable  Post vital signs: Reviewed and stable  Last Vitals:  Vitals Value Taken Time  BP 156/62 07/26/22 1331  Temp    Pulse 83 07/26/22 1333  Resp 19 07/26/22 1333  SpO2 98 % 07/26/22 1333  Vitals shown include unvalidated device data.  Last Pain:  Vitals:   07/26/22 1109  TempSrc:   PainSc: 0-No pain      Patients Stated Pain Goal: 3 (52/71/29 2909)  Complications: No notable events documented.

## 2022-07-26 NOTE — Anesthesia Postprocedure Evaluation (Signed)
Anesthesia Post Note  Patient: Kaitlyn Newton  Procedure(s) Performed: REVERSE SHOULDER ARTHROPLASTY (Right: Shoulder)     Patient location during evaluation: PACU Anesthesia Type: General Level of consciousness: awake and alert Pain management: pain level controlled Vital Signs Assessment: post-procedure vital signs reviewed and stable Respiratory status: spontaneous breathing, nonlabored ventilation, respiratory function stable and patient connected to nasal cannula oxygen Cardiovascular status: blood pressure returned to baseline and stable Postop Assessment: no apparent nausea or vomiting Anesthetic complications: no   No notable events documented.  Last Vitals:  Vitals:   07/26/22 1600 07/26/22 1753  BP: (!) 120/50 (!) 141/51  Pulse: 72 78  Resp: 16 14  Temp: 36.6 C 36.8 C  SpO2: 95% 95%    Last Pain:  Vitals:   07/26/22 1753  TempSrc: Oral  PainSc:                  Santa Lighter

## 2022-07-26 NOTE — Interval H&P Note (Signed)
History and Physical Interval Note:  07/26/2022 9:53 AM  Dionicio Stall Mehler  has presented today for surgery, with the diagnosis of right shoulder rotator cuff arthropathy.  The various methods of treatment have been discussed with the patient and family. After consideration of risks, benefits and other options for treatment, the patient has consented to  Procedure(s) with comments: REVERSE SHOULDER ARTHROPLASTY (Right) - with ISB as a surgical intervention.  The patient's history has been reviewed, patient examined, no change in status, stable for surgery.  I have reviewed the patient's chart and labs.  Questions were answered to the patient's satisfaction.     Augustin Schooling

## 2022-07-26 NOTE — Discharge Instructions (Signed)
Ice to the shoulder constantly.  Keep the incision covered and clean and dry for one week, then ok to get it wet in the shower. ° °Do exercise as instructed several times per day. ° °DO NOT reach behind your back or push up out of a chair with the operative arm. ° °Use a sling while you are up and around for comfort, may remove while seated.  Keep pillow propped behind the operative elbow. ° °Follow up with Dr Takyia Sindt in two weeks in the office, call [phone] for appt °

## 2022-07-26 NOTE — Plan of Care (Signed)
Plan of care reviewed and discussed with the patient. 

## 2022-07-26 NOTE — Anesthesia Procedure Notes (Signed)
Procedure Name: Intubation Date/Time: 07/26/2022 11:42 AM  Performed by: Eben Burow, CRNAPre-anesthesia Checklist: Patient identified, Emergency Drugs available, Suction available, Patient being monitored and Timeout performed Patient Re-evaluated:Patient Re-evaluated prior to induction Oxygen Delivery Method: Circle system utilized Preoxygenation: Pre-oxygenation with 100% oxygen Induction Type: IV induction Ventilation: Mask ventilation without difficulty Laryngoscope Size: Mac and 4 Grade View: Grade I Tube type: Oral Tube size: 7.0 mm Number of attempts: 1 Airway Equipment and Method: Stylet Placement Confirmation: ETT inserted through vocal cords under direct vision, positive ETCO2 and breath sounds checked- equal and bilateral Secured at: 21 cm Tube secured with: Tape Dental Injury: Teeth and Oropharynx as per pre-operative assessment

## 2022-07-26 NOTE — Op Note (Signed)
Kaitlyn Newton, Kaitlyn Newton MEDICAL RECORD NO: [id number] ACCOUNT NO: [id number] DATE OF BIRTH: [birth year] FACILITY: [facility]ATION: [facility] PHYSICIAN: Doran Heater. Veverly Fells, MD  Operative Report   DATE OF PROCEDURE: 07/26/2022  PREOPERATIVE DIAGNOSES:  Right shoulder rotator cuff tear arthropathy.  POSTOPERATIVE DIAGNOSIS:  Right shoulder rotator cuff tear arthropathy.  PROCEDURE PERFORMED:  Right reverse shoulder placement using DePuy Delta Xtend prosthesis with no subscap repair.  ATTENDING SURGEON:  Doran Heater. Veverly Fells, MD  ASSISTANT:  Charletta Cousin [place], [place], who was scrubbed during the entire procedure, and necessary for satisfactory completion of surgery.  ANESTHESIA:  General anesthesia was used plus interscalene block.  ESTIMATED BLOOD LOSS:  150 mL  FLUID REPLACEMENT:  1500 mL crystalloid.  INSTRUMENT COUNTS:  Correct.  COMPLICATIONS:  No complications.  ANTIBIOTICS:  Perioperative antibiotics were given.  INDICATIONS:  The patient is an 81 year old female who presents with a history of worsening right shoulder pain and dysfunction secondary to end-stage arthritis and rotator cuff insufficiency.  The patient has failed an extended period of conservative  management, desires operative treatment to relieve pain and restore function.  Informed consent obtained.  DESCRIPTION OF PROCEDURE:  After an adequate level of anesthesia was achieved, the patient was positioned in modified beach chair position.  Right shoulder correctly identified and sterilely prepped and draped in the usual manner.  Timeout called,  verifying correct patient, correct site.  Right shoulder entered using a standard deltopectoral approach, starting at the coracoid process extending down to the anterior humerus.  We used a #10 blade scalpel for skin incision.  Dissection down through  subcutaneous tissues using Bovie.  We identified the cephalic vein and took that laterally with the deltoid, pectoralis taken  medially.  Conjoined tendon identified and retracted medially.  The self-retaining retractor was placed.  We tenodesed the  biceps in situ with 0 Vicryl figure-of-eight suture.  We then released the subscap remnant off the lesser tuberosity and tagged for protection of the axillary nerve.  We released the inferior capsule progressively externally rotating and delivering the  humeral head on the wound.  We entered the proximal humerus with a 6 mm reamer and I could only ream up to a size 8.  We then placed her 8 mm T handle guide and resected the head at 20 degrees of retroversion with the oscillating saw.  Once we had the  head resected, we removed excess osteophytes with a rongeur.  We then subluxed the humerus posteriorly.  We then went ahead and placed our deep retractors and gained good exposure with the glenoid face.  We removed the capsule and labrum and the biceps  stump.  We then placed our guide pin centered in the glenoid face, centered low and we reamed with the metaglene baseplate.  The patient had extremely small glenoid.  We did our peripheral hand reaming, we drilled out our central peg hole.  Once we had  the glenoid prepped.  We irrigated thoroughly.  We then impacted our HA coated press-fit baseplate into place.  We placed a 36 screw inferiorly, a 30 screw at the base of the coracoid.  We had really good baseplate security.  We next went ahead and  irrigated thoroughly and then applied the 38 standard glenosphere to the baseplate and secured that with a screwdriver.  I did a finger sweep to make sure we had no soft tissue caught up between the baseplate and the glenosphere.  We then went to the  humeral side.  We  prepped the proximal part of the humerus with a reamer for the one right metaphysis.  We then trialled with the size 8 stem with 1 right set on the 0 setting and impacted in 20 degrees of retroversion.  We used a 38+3 trial, reduced the  shoulder, we had appropriate soft tissue  balancing and stability.  We removed the trial components, irrigated thoroughly and then used available bone graft from the humeral head and press-fit technique with the Por[place]oat size 8 stem and the one right  metaphysis set on the 0 setting and impacted in 20 degrees of retroversion. With the stem in place, we went ahead and selected the real 38+3 poly impacted on the humeral tray and reduced the shoulder, had nice little pop as it reduced.  Appropriate  tension on the conjoined tendon.  No gapping with inferior pole or external rotation and excellent range of motion.  We irrigated again and we resected the subscap remnant.  We then repaired the deltopectoral interval with 0 Vicryl suture followed by 2-0  Vicryl for subcutaneous closure and 4-0 Monocryl for skin.  Steri-Strips applied followed by sterile dressing.  The patient tolerated surgery well.   PUS D: 07/26/2022 1:10:32 pm T: 07/26/2022 4:14:00 pm  JOB: [id number]/ [id number]

## 2022-07-26 NOTE — Anesthesia Preprocedure Evaluation (Addendum)
Anesthesia Evaluation  Patient identified by MRN, date of birth, ID band Patient awake    Reviewed: Allergy & Precautions, NPO status , Patient's Chart, lab work & pertinent test results, reviewed documented beta blocker date and time   History of Anesthesia Complications (+) PONV and history of anesthetic complications  Airway Mallampati: II  TM Distance: >3 FB Neck ROM: Full    Dental  (+) Teeth Intact, Dental Advisory Given, Caps,    Pulmonary neg pulmonary ROS,    Pulmonary exam normal breath sounds clear to auscultation       Cardiovascular hypertension, Pt. on home beta blockers and Pt. on medications Normal cardiovascular exam Rhythm:Regular Rate:Normal     Neuro/Psych PSYCHIATRIC DISORDERS Anxiety negative neurological ROS     GI/Hepatic Neg liver ROS, GERD  Medicated,  Endo/Other  negative endocrine ROS  Renal/GU Renal InsufficiencyRenal disease     Musculoskeletal  (+) Arthritis , right shoulder rotator cuff arthropathy   Abdominal   Peds  Hematology negative hematology ROS (+)   Anesthesia Other Findings Day of surgery medications reviewed with the patient.  Reproductive/Obstetrics                           Anesthesia Physical Anesthesia Plan  ASA: 3  Anesthesia Plan: General   Post-op Pain Management: Regional block* and Tylenol PO (pre-op)*   Induction: Intravenous  PONV Risk Score and Plan: 4 or greater and Dexamethasone, Ondansetron and Treatment may vary due to age or medical condition  Airway Management Planned: Oral ETT  Additional Equipment:   Intra-op Plan:   Post-operative Plan: Extubation in OR  Informed Consent: I have reviewed the patients History and Physical, chart, labs and discussed the procedure including the risks, benefits and alternatives for the proposed anesthesia with the patient or authorized representative who has indicated his/her  understanding and acceptance.     Dental advisory given  Plan Discussed with: CRNA  Anesthesia Plan Comments:         Anesthesia Quick Evaluation

## 2022-07-26 NOTE — Brief Op Note (Signed)
07/26/2022  1:04 PM  PATIENT:  Kaitlyn Newton  81 y.o. female  PRE-OPERATIVE DIAGNOSIS:  right shoulder rotator cuff arthropathy  POST-OPERATIVE DIAGNOSIS:  right shoulder rotator cuff arthropathy  PROCEDURE:  Procedure(s) with comments: REVERSE SHOULDER ARTHROPLASTY (Right) - with ISB DePuy Delta Xtend with NO subscap repair  SURGEON:  Surgeon(s) and Role:    Netta Cedars, MD - Primary  PHYSICIAN ASSISTANT:   ASSISTANTS: [place] Bruns, PA-C   ANESTHESIA:   regional and general  EBL:  50 mL   BLOOD ADMINISTERED:none  DRAINS: none   LOCAL MEDICATIONS USED:  MARCAINE     SPECIMEN:  No Specimen  DISPOSITION OF SPECIMEN:  N/A  COUNTS:  YES  TOURNIQUET:  * No tourniquets in log *  DICTATION: .Other Dictation: Dictation Number [id number]  PLAN OF CARE: Admit for overnight observation  PATIENT DISPOSITION:  PACU - hemodynamically stable.   Delay start of Pharmacological VTE agent (>24hrs) due to surgical blood loss or risk of bleeding: not applicable

## 2022-07-26 NOTE — Plan of Care (Signed)
  Problem: Education: Goal: Knowledge of the prescribed therapeutic regimen will improve Outcome: Progressing   Problem: Pain Management: Goal: Pain level will decrease with appropriate interventions Outcome: Progressing   Problem: Education: Goal: Knowledge of General Education information will improve Description: Including pain rating scale, medication(s)/side effects and non-pharmacologic comfort measures Outcome: Progressing   

## 2022-07-27 DIAGNOSIS — N183 Chronic kidney disease, stage 3 unspecified: Secondary | ICD-10-CM | POA: Diagnosis not present

## 2022-07-27 DIAGNOSIS — Z79899 Other long term (current) drug therapy: Secondary | ICD-10-CM | POA: Diagnosis not present

## 2022-07-27 DIAGNOSIS — M75121 Complete rotator cuff tear or rupture of right shoulder, not specified as traumatic: Secondary | ICD-10-CM | POA: Diagnosis not present

## 2022-07-27 DIAGNOSIS — I129 Hypertensive chronic kidney disease with stage 1 through stage 4 chronic kidney disease, or unspecified chronic kidney disease: Secondary | ICD-10-CM | POA: Diagnosis not present

## 2022-07-27 LAB — HEMOGLOBIN AND HEMATOCRIT, BLOOD
HCT: 35.1 % — ABNORMAL LOW (ref 36.0–46.0)
Hemoglobin: 12 g/dL (ref 12.0–15.0)

## 2022-07-27 LAB — BASIC METABOLIC PANEL
Anion gap: 13 (ref 5–15)
BUN: 15 mg/dL (ref 8–23)
CO2: 22 mmol/L (ref 22–32)
Calcium: 9.5 mg/dL (ref 8.9–10.3)
Chloride: 99 mmol/L (ref 98–111)
Creatinine, Ser: 0.83 mg/dL (ref 0.44–1.00)
GFR, Estimated: >60 mL/min (ref >60)
Glucose, Bld: 118 mg/dL — ABNORMAL HIGH (ref 70–99)
Potassium: 3.8 mmol/L (ref 3.5–5.1)
Sodium: 134 mmol/L — ABNORMAL LOW (ref 135–145)

## 2022-07-27 NOTE — Progress Notes (Signed)
   Subjective:  Patient reports pain as mild.  Good night last night.  Doing well this morning.  She did have some issues getting comfortable and sleeping but more related to urinary frequency and being in a strange apartment.  She feels ready to go home today.  Objective:   VITALS:   Vitals:   07/26/22 1753 07/26/22 2111 07/27/22 0028 07/27/22 0646  BP: (!) 141/51 (!) 174/68 (!) 151/65 (!) 153/62  Pulse: 78 81 79 80  Resp: '14 18 18 18  '$ Temp: 98.3 F (36.8 C) 98.6 F (37 C) 98.1 F (36.7 C) 98.1 F (36.7 C)  TempSrc: Oral Oral  Oral  SpO2: 95% 96% 94% 95%  Weight:      Height:        Neurologically intact ABD soft Sensation intact distally Intact pulses distally Incision: dressing C/D/I, no drainage, and sling is donned No cellulitis present   Lab Results  Component Value Date   WBC 6.7 07/09/2022   HGB 12.0 07/27/2022   HCT 35.1 (L) 07/27/2022   MCV 95.9 07/09/2022   PLT 271 07/09/2022   BMET    Component Value Date/Time   NA 134 (L) 07/27/2022 0443   K 3.8 07/27/2022 0443   CL 99 07/27/2022 0443   CO2 22 07/27/2022 0443   GLUCOSE 118 (H) 07/27/2022 0443   BUN 15 07/27/2022 0443   CREATININE 0.83 07/27/2022 0443   CALCIUM 9.5 07/27/2022 0443   GFRNONAA >60 07/27/2022 0443     Assessment/Plan: 1 Day Post-Op   Principal Problem:   S/P shoulder replacement, right   Advance diet Up with therapy  -Doing well and anticipate discharge home after occupational therapy today.  -Nonweightbearing to the right upper extremity but continue to follow Dr. Gilberto Better postop protocol.  -Aquacel to right shoulder incision prior to DC.  -DC home today.   Nicholes Stairs 07/27/2022, 9:46 AM   Geralynn Rile, MD [phone]

## 2022-07-27 NOTE — Evaluation (Addendum)
Occupational Therapy Evaluation Patient Details Name: Kaitlyn Newton MRN: [id number] DOB: [birth year] Today's Date: 07/27/2022   History of Present Illness Kaitlyn Newton is a 81 yr old female who is s/p a R reverse shoulder replacement with no subscap repair on 07-26-2022, secondary to a rotator cuff tear arthropathy.   Clinical Impression   Therapist provided education and instruction to patient and her spouse with regards to R UE ROM/exercises, shoulder precautions, proper UE positioning, donning upper extremity clothing, recommendations for bathing while maintaining shoulder precautions, use of ice for edema and pain management, and how to correctly donn/doff sling. Patient and spouse verbalized understanding and demonstrated understanding as needed. Patient needed min assistance to donn shirt, pants, and shoes with instruction provided on compensatory strategies to perform self-care tasks. Patient to follow up with MD for further therapy needs.        Recommendations for follow up therapy are one component of a multi-disciplinary discharge planning process, led by the attending physician.  Recommendations may be updated based on patient status, additional functional criteria and insurance authorization.   Follow Up Recommendations  Follow physician's recommendations for discharge plan and follow up therapies    Assistance Recommended at Discharge Set up Supervision/Assistance  Patient can return home with the following Assist for transportation;Help with stairs or ramp for entrance;Assistance with cooking/housework;A little help with bathing/dressing/bathroom    Functional Status Assessment  Patient has had a recent decline in their functional status and demonstrates the ability to make significant improvements in function in a reasonable and predictable amount of time.  Equipment Recommendations  Tub/shower seat      Precautions / Restrictions Precautions Precautions:  Shoulder Precaution Booklet Issued: Yes (comment) Precaution Comments: R UE sling at all times except ADLs and exercise, R UE NWB, Okay to perform AROM of elbow, wrist, and hand to tolerance, no PROM of shoulder, no AROM of shoulder Required Braces or Orthoses: Sling Restrictions Weight Bearing Restrictions: Yes RUE Weight Bearing: Non weight bearing      Mobility        Transfers Overall transfer level: Independent          Balance     Sitting balance-Leahy Scale: Good       Standing balance-Leahy Scale: Good             ADL either performed or assessed with clinical judgement      Vision Patient Visual Report: No change from baseline Additional Comments: She correctly read the time depicted on the wall clock.            Pertinent Vitals/Pain Pain Assessment Pain Assessment: 0-10 Pain Score: 7  Pain Location: R shoulder Pain Intervention(s): Repositioned; She reported receiving pain medication.     Hand Dominance Right   Extremity/Trunk Assessment Upper Extremity Assessment Upper Extremity Assessment:  (L UE, B LE, and R UE elbow, wrist, and hand AROM WFL. R shoulder not assessed)   Lower Extremity Assessment Lower Extremity Assessment: Overall WFL for tasks assessed       Communication Communication Communication: No difficulties   Cognition Arousal/Alertness: Awake/alert Behavior During Therapy: WFL for tasks assessed/performed Overall Cognitive Status: Within Functional Limits for tasks assessed            General Comments: Oriented x4, able to follow commands without difficulty           Shoulder Instructions Shoulder Instructions Donning/doffing shirt without moving shoulder: Patient able to independently direct caregiver Method for sponge bathing under operated UE: Patient  able to independently direct caregiver Donning/doffing sling/immobilizer: Patient able to independently direct caregiver Correct positioning of  sling/immobilizer: Patient able to independently direct caregiver ROM for elbow, wrist and digits of operated UE: Caregiver independent with task;Patient able to independently direct caregiver Sling wearing schedule (on at all times/off for ADL's): Patient able to independently direct caregiver;Caregiver independent with task Proper positioning of operated UE when showering: Patient able to independently direct caregiver Dressing change: Patient able to independently direct caregiver;Caregiver independent with task Positioning of UE while sleeping: Patient able to independently direct caregiver;Caregiver independent with task    Home Living Family/patient expects to be discharged to:: Private residence Living Arrangements: Spouse/significant other   Type of Home: House Home Access: Stairs to enter Technical brewer of Steps: 1   Home Layout: One level     Bathroom Shower/Tub: Chief Strategy Officer: None          Prior Functioning/Environment Prior Level of Function : Independent/Modified Independent             Mobility Comments: She was independent with ambulation and driving. ADLs Comments: She was independent with ADLs & cooking.        OT Problem List: Impaired UE functional use;Pain                   AM-PAC OT "6 Clicks" Daily Activity     Outcome Measure Help from another person eating meals?: None Help from another person taking care of personal grooming?: None Help from another person toileting, which includes using toliet, bedpan, or urinal?: A Little Help from another person bathing (including washing, rinsing, drying)?: A Little Help from another person to put on and taking off regular upper body clothing?: A Little Help from another person to put on and taking off regular lower body clothing?: None 6 Click Score: 21   End of Session Nurse Communication:  (Nurse cleared the patient for therapy participation)  Activity  Tolerance: Patient tolerated treatment well Patient left: in bed;with call bell/phone within reach;with family/visitor present  OT Visit Diagnosis: Pain;Muscle weakness (generalized) (M62.81) Pain - Right/Left: Right Pain - part of body: Shoulder                Time: 1001-1025 OT Time Calculation (min): 24 min Charges:  OT General Charges $OT Visit: 1 Visit OT Evaluation $OT Eval Low Complexity: 1 Low OT Treatments $Self Care/Home Management : 8-22 mins    Leota Sauers, OTR/L 07/27/2022, 11:32 AM

## 2022-07-27 NOTE — Plan of Care (Signed)
  Problem: Education: Goal: Knowledge of the prescribed therapeutic regimen will improve Outcome: Progressing   Problem: Pain Management: Goal: Pain level will decrease with appropriate interventions Outcome: Progressing   Problem: Elimination: Goal: Will not experience complications related to bowel motility Outcome: Progressing

## 2022-07-27 NOTE — TOC Initial Note (Signed)
Transition of Care [facility]) - Initial/Assessment Note    Patient Details  Name: Kaitlyn Newton MRN: [id number] Date of Birth: 04-18-1941  Transition of Care [facility]) CM/SW Contact:    Henrietta Dine, RN Phone Number: 07/27/2022, 10:13 AM  Clinical Narrative:     Transition of Care [facility]) Screening Note   Patient Details  Name: Kaitlyn Newton Date of Birth: 05/12/1941   Transition of Care [facility]) CM/SW Contact:    Henrietta Dine, RN Phone Number: 07/27/2022, 10:14 AM  Transition of Care Department [facility]) has reviewed patient and no TOC needs have been identified at this time. We will continue to monitor patient advancement through interdisciplinary progression rounds. If new patient transition needs arise, please place a TOC consult.                       Patient Goals and CMS Choice        Expected Discharge Plan and Services           Expected Discharge Date: 07/27/22                                    Prior Living Arrangements/Services                       Activities of Daily Living Home Assistive Devices/Equipment: Other (Comment) (see prev charted) ADL Screening (condition at time of admission) Patient's cognitive ability adequate to safely complete daily activities?: Yes Is the patient deaf or have difficulty hearing?: No Does the patient have difficulty seeing, even when wearing glasses/contacts?: No Does the patient have difficulty concentrating, remembering, or making decisions?: No Patient able to express need for assistance with ADLs?: Yes Does the patient have difficulty dressing or bathing?: No Independently performs ADLs?: Yes (appropriate for developmental age) Does the patient have difficulty walking or climbing stairs?: No Weakness of Legs: None Weakness of Arms/Hands: Right  Permission Sought/Granted                  Emotional Assessment              Admission diagnosis:  S/P  shoulder replacement, right [Z96.611] Patient Active Problem List   Diagnosis Date Noted   S/P shoulder replacement, right 07/26/2022   PCP:  Cari Caraway, MD Pharmacy:   [id number] [place] [address] [place] [place]Phone: [phone]: [phone]    Social Determinants of Health (SDOH) Interventions    Readmission Risk Interventions     No data to display

## 2022-07-29 ENCOUNTER — Encounter (HOSPITAL_COMMUNITY): Payer: Self-pay | Admitting: Orthopedic Surgery

## 2022-08-06 NOTE — Discharge Summary (Signed)
In most cases prophylactic antibiotics for Dental procdeures after total joint surgery are not necessary.  Exceptions are as follows:  1. History of prior total joint infection  2. Severely immunocompromised (Organ Transplant, cancer chemotherapy, Rheumatoid biologic meds such as [place])  3. Poorly controlled diabetes (A1C &gt; 8.0, blood glucose over 200)  If you have one of these conditions, contact your surgeon for an antibiotic prescription, prior to your dental procedure. Orthopedic Discharge Summary        Physician Discharge Summary  Patient ID: Kaitlyn Newton MRN: [id number] DOB/AGE: [birth year] 81 y.o.  Admit date: 07/26/2022 Discharge date: 07/27/22  Procedures:  Procedure(s) (LRB): REVERSE SHOULDER ARTHROPLASTY (Right)  Attending Physician:  Dr. Esmond Plants  Admission Diagnoses:   left shoulder cuff arthropathy  Discharge Diagnoses:  left shoulder cuff arthropathy   Past Medical History:  Diagnosis Date   Anxiety    Arthritis    hands   CKD (chronic kidney disease), stage III ([place])    no nephrologist   GERD (gastroesophageal reflux disease)    Heart murmur    no cardiologist   High cholesterol    HTN (hypertension)    states under control with meds., has been on med. since 1990s   Mucoid cyst of joint 12/2016   left long finger   PONV (postoperative nausea and vomiting)    after appendectomy with ether    PCP: Cari Caraway, MD   Discharged Condition: good  Hospital Course:  Patient underwent the above stated procedure on 07/26/2022. Patient tolerated the procedure well and brought to the recovery room in good condition and subsequently to the floor. Patient had an uncomplicated hospital course and was stable for discharge.   Disposition: Discharge disposition: 01-Home or Self Care      with follow up in 2 weeks    Follow-up Information     Netta Cedars, MD Follow up.   Specialty: Orthopedic Surgery Contact  information: [address] [place] [address] [phone]                 Dental Antibiotics:  In most cases prophylactic antibiotics for Dental procdeures after total joint surgery are not necessary.  Exceptions are as follows:  1. History of prior total joint infection  2. Severely immunocompromised (Organ Transplant, cancer chemotherapy, Rheumatoid biologic meds such as [place])  3. Poorly controlled diabetes (A1C &gt; 8.0, blood glucose over 200)  If you have one of these conditions, contact your surgeon for an antibiotic prescription, prior to your dental procedure.  Discharge Instructions     Call MD / Call 911   Complete by: As directed    If you experience chest pain or shortness of breath, CALL 911 and be transported to the hospital emergency room.  If you develope a fever above 101 F, pus (white drainage) or increased drainage or redness at the wound, or calf pain, call your surgeon's office.   Constipation Prevention   Complete by: As directed    Drink plenty of fluids.  Prune juice may be helpful.  You may use a stool softener, such as Colace (over the counter) 100 mg twice a day.  Use MiraLax (over the counter) for constipation as needed.   Diet - low sodium heart healthy   Complete by: As directed    Increase activity slowly as tolerated   Complete by: As directed    Post-operative opioid taper instructions:   Complete by: As directed    POST-OPERATIVE OPIOID  TAPER INSTRUCTIONS: It is important to wean off of your opioid medication as soon as possible. If you do not need pain medication after your surgery it is ok to stop day one. Opioids include: Codeine, Hydrocodone(Norco, Vicodin), Oxycodone(Percocet, oxycontin) and hydromorphone amongst others.  Long term and even short term use of opiods can cause: Increased pain response Dependence Constipation Depression Respiratory depression And more.  Withdrawal symptoms can  include Flu like symptoms Nausea, vomiting And more Techniques to manage these symptoms Hydrate well Eat regular healthy meals Stay active Use relaxation techniques(deep breathing, meditating, yoga) Do Not substitute Alcohol to help with tapering If you have been on opioids for less than two weeks and do not have pain than it is ok to stop all together.  Plan to wean off of opioids This plan should start within one week post op of your joint replacement. Maintain the same interval or time between taking each dose and first decrease the dose.  Cut the total daily intake of opioids by one tablet each day Next start to increase the time between doses. The last dose that should be eliminated is the evening dose.          Allergies as of 07/27/2022       Reactions   Ceclor [cefaclor] Swelling   SWELLING OF FACE   Macrodantin [nitrofurantoin] Swelling   SWELLING OF FACE   Vancomycin Rash        Medication List     TAKE these medications    acetaminophen 650 MG CR tablet Commonly known as: TYLENOL Take 1,300 mg by mouth in the morning and at bedtime.   amLODipine 10 MG tablet Commonly known as: NORVASC Take 10 mg by mouth at bedtime.   B-12 500 MCG Subl Place 500 mcg under the tongue every morning.   Calcium Carbonate-Vitamin D 600-10 MG-MCG Tabs Take 1 tablet by mouth 2 (two) times daily.   colestipol 1 g tablet Commonly known as: COLESTID Take 2 g by mouth daily after breakfast.   diclofenac Sodium 1 % Gel Commonly known as: VOLTAREN Apply 1 Application topically in the morning and at bedtime.   escitalopram 20 MG tablet Commonly known as: LEXAPRO Take 20 mg by mouth daily.   esomeprazole 40 MG capsule Commonly known as: NEXIUM Take 40 mg by mouth daily before supper.   fenofibrate micronized 134 MG capsule Commonly known as: LOFIBRA Take 134 mg by mouth at bedtime.   Glucosamine-Chondroitin 750-600 MG Tabs Take 1 tablet by mouth 2 (two) times  daily.   HYDROcodone-acetaminophen 5-325 MG tablet Commonly known as: Norco Take 1 tablet by mouth every 6 (six) hours as needed for moderate pain or severe pain.   levocetirizine 5 MG tablet Commonly known as: XYZAL Take 5 mg by mouth every evening.   lisinopril-hydrochlorothiazide 20-12.5 MG tablet Commonly known as: ZESTORETIC Take 2 tablets by mouth daily.   metoprolol succinate 100 MG 24 hr tablet Commonly known as: TOPROL-XL Take 100 mg by mouth daily.   rosuvastatin 20 MG tablet Commonly known as: CRESTOR Take 20 mg by mouth every other day.   traMADol 50 MG tablet Commonly known as: ULTRAM Take 50 mg by mouth 2 (two) times daily.   vitamin C 1000 MG tablet Take 1,000 mg by mouth daily.   Vitamin D 50 MCG (2000 UT) Caps Take 2,000 Units by mouth daily.          Signed: Ventura Bruns 08/06/2022, 1:19 PM  [facility] is now  [facility]  [place] [address] [place], [place], [place] [address] Phone: [phone] Facebook  [place]

## 2022-08-08 DIAGNOSIS — Z4789 Encounter for other orthopedic aftercare: Secondary | ICD-10-CM | POA: Diagnosis not present

## 2022-09-10 DIAGNOSIS — Z4789 Encounter for other orthopedic aftercare: Secondary | ICD-10-CM | POA: Diagnosis not present

## 2022-09-27 DIAGNOSIS — M25511 Pain in right shoulder: Secondary | ICD-10-CM | POA: Diagnosis not present

## 2022-10-01 DIAGNOSIS — M25511 Pain in right shoulder: Secondary | ICD-10-CM | POA: Diagnosis not present

## 2022-10-09 DIAGNOSIS — M25511 Pain in right shoulder: Secondary | ICD-10-CM | POA: Diagnosis not present

## 2022-10-18 DIAGNOSIS — M25511 Pain in right shoulder: Secondary | ICD-10-CM | POA: Diagnosis not present

## 2022-10-24 DIAGNOSIS — M25511 Pain in right shoulder: Secondary | ICD-10-CM | POA: Diagnosis not present

## 2022-11-12 DIAGNOSIS — M25511 Pain in right shoulder: Secondary | ICD-10-CM | POA: Diagnosis not present

## 2023-01-09 DIAGNOSIS — M25561 Pain in right knee: Secondary | ICD-10-CM | POA: Diagnosis not present

## 2023-01-09 DIAGNOSIS — M25562 Pain in left knee: Secondary | ICD-10-CM | POA: Diagnosis not present

## 2023-01-13 DIAGNOSIS — G629 Polyneuropathy, unspecified: Secondary | ICD-10-CM | POA: Diagnosis not present

## 2023-01-13 DIAGNOSIS — Z79899 Other long term (current) drug therapy: Secondary | ICD-10-CM | POA: Diagnosis not present

## 2023-01-13 DIAGNOSIS — R69 Illness, unspecified: Secondary | ICD-10-CM | POA: Diagnosis not present

## 2023-01-13 DIAGNOSIS — M85852 Other specified disorders of bone density and structure, left thigh: Secondary | ICD-10-CM | POA: Diagnosis not present

## 2023-01-13 DIAGNOSIS — I10 Essential (primary) hypertension: Secondary | ICD-10-CM | POA: Diagnosis not present

## 2023-01-13 DIAGNOSIS — E782 Mixed hyperlipidemia: Secondary | ICD-10-CM | POA: Diagnosis not present

## 2023-01-13 DIAGNOSIS — Z6826 Body mass index (BMI) 26.0-26.9, adult: Secondary | ICD-10-CM | POA: Diagnosis not present

## 2023-01-13 DIAGNOSIS — K219 Gastro-esophageal reflux disease without esophagitis: Secondary | ICD-10-CM | POA: Diagnosis not present

## 2023-01-31 DIAGNOSIS — Z1389 Encounter for screening for other disorder: Secondary | ICD-10-CM | POA: Diagnosis not present

## 2023-01-31 DIAGNOSIS — Z0000 Encounter for general adult medical examination without abnormal findings: Secondary | ICD-10-CM | POA: Diagnosis not present

## 2023-02-21 DIAGNOSIS — Z79899 Other long term (current) drug therapy: Secondary | ICD-10-CM | POA: Diagnosis not present

## 2023-03-07 DIAGNOSIS — I10 Essential (primary) hypertension: Secondary | ICD-10-CM | POA: Diagnosis not present

## 2023-03-07 DIAGNOSIS — Z79899 Other long term (current) drug therapy: Secondary | ICD-10-CM | POA: Diagnosis not present

## 2023-04-07 DIAGNOSIS — E782 Mixed hyperlipidemia: Secondary | ICD-10-CM | POA: Diagnosis not present

## 2023-04-07 DIAGNOSIS — I2510 Atherosclerotic heart disease of native coronary artery without angina pectoris: Secondary | ICD-10-CM | POA: Diagnosis not present

## 2023-04-07 DIAGNOSIS — M85852 Other specified disorders of bone density and structure, left thigh: Secondary | ICD-10-CM | POA: Diagnosis not present

## 2023-04-07 DIAGNOSIS — N1832 Chronic kidney disease, stage 3b: Secondary | ICD-10-CM | POA: Diagnosis not present

## 2023-04-07 DIAGNOSIS — F411 Generalized anxiety disorder: Secondary | ICD-10-CM | POA: Diagnosis not present

## 2023-04-07 DIAGNOSIS — I10 Essential (primary) hypertension: Secondary | ICD-10-CM | POA: Diagnosis not present

## 2023-04-07 DIAGNOSIS — R232 Flushing: Secondary | ICD-10-CM | POA: Diagnosis not present

## 2023-04-07 DIAGNOSIS — K219 Gastro-esophageal reflux disease without esophagitis: Secondary | ICD-10-CM | POA: Diagnosis not present

## 2023-04-07 DIAGNOSIS — Z79899 Other long term (current) drug therapy: Secondary | ICD-10-CM | POA: Diagnosis not present

## 2023-04-07 DIAGNOSIS — I700 Atherosclerosis of aorta: Secondary | ICD-10-CM | POA: Diagnosis not present

## 2023-04-21 DIAGNOSIS — Z1231 Encounter for screening mammogram for malignant neoplasm of breast: Secondary | ICD-10-CM | POA: Diagnosis not present

## 2023-04-23 DIAGNOSIS — M5416 Radiculopathy, lumbar region: Secondary | ICD-10-CM | POA: Diagnosis not present

## 2023-04-29 DIAGNOSIS — M5416 Radiculopathy, lumbar region: Secondary | ICD-10-CM | POA: Diagnosis not present

## 2023-05-12 DIAGNOSIS — L280 Lichen simplex chronicus: Secondary | ICD-10-CM | POA: Diagnosis not present

## 2023-05-12 DIAGNOSIS — D23112 Other benign neoplasm of skin of right lower eyelid, including canthus: Secondary | ICD-10-CM | POA: Diagnosis not present

## 2023-05-12 DIAGNOSIS — D225 Melanocytic nevi of trunk: Secondary | ICD-10-CM | POA: Diagnosis not present

## 2023-05-12 DIAGNOSIS — D224 Melanocytic nevi of scalp and neck: Secondary | ICD-10-CM | POA: Diagnosis not present

## 2023-05-12 DIAGNOSIS — L821 Other seborrheic keratosis: Secondary | ICD-10-CM | POA: Diagnosis not present

## 2023-05-15 DIAGNOSIS — M5416 Radiculopathy, lumbar region: Secondary | ICD-10-CM | POA: Diagnosis not present

## 2023-05-22 DIAGNOSIS — M5416 Radiculopathy, lumbar region: Secondary | ICD-10-CM | POA: Diagnosis not present

## 2023-06-09 DIAGNOSIS — M5416 Radiculopathy, lumbar region: Secondary | ICD-10-CM | POA: Diagnosis not present

## 2023-06-09 DIAGNOSIS — M5136 Other intervertebral disc degeneration, lumbar region: Secondary | ICD-10-CM | POA: Diagnosis not present

## 2023-07-28 DIAGNOSIS — I10 Essential (primary) hypertension: Secondary | ICD-10-CM | POA: Diagnosis not present

## 2023-07-28 DIAGNOSIS — M85852 Other specified disorders of bone density and structure, left thigh: Secondary | ICD-10-CM | POA: Diagnosis not present

## 2023-07-28 DIAGNOSIS — E782 Mixed hyperlipidemia: Secondary | ICD-10-CM | POA: Diagnosis not present

## 2023-07-28 DIAGNOSIS — E538 Deficiency of other specified B group vitamins: Secondary | ICD-10-CM | POA: Diagnosis not present

## 2023-07-29 DIAGNOSIS — H35033 Hypertensive retinopathy, bilateral: Secondary | ICD-10-CM | POA: Diagnosis not present

## 2023-07-29 DIAGNOSIS — H524 Presbyopia: Secondary | ICD-10-CM | POA: Diagnosis not present

## 2023-08-04 DIAGNOSIS — N3000 Acute cystitis without hematuria: Secondary | ICD-10-CM | POA: Diagnosis not present

## 2023-08-04 DIAGNOSIS — R350 Frequency of micturition: Secondary | ICD-10-CM | POA: Diagnosis not present

## 2023-08-06 DIAGNOSIS — H524 Presbyopia: Secondary | ICD-10-CM | POA: Diagnosis not present

## 2023-08-06 DIAGNOSIS — H52223 Regular astigmatism, bilateral: Secondary | ICD-10-CM | POA: Diagnosis not present

## 2023-08-23 DIAGNOSIS — M5416 Radiculopathy, lumbar region: Secondary | ICD-10-CM | POA: Diagnosis not present

## 2023-09-11 DIAGNOSIS — Z96611 Presence of right artificial shoulder joint: Secondary | ICD-10-CM | POA: Diagnosis not present

## 2023-09-11 DIAGNOSIS — M25511 Pain in right shoulder: Secondary | ICD-10-CM | POA: Diagnosis not present

## 2023-09-29 DIAGNOSIS — M5416 Radiculopathy, lumbar region: Secondary | ICD-10-CM | POA: Diagnosis not present

## 2023-11-07 ENCOUNTER — Encounter: Payer: Self-pay | Admitting: Physical Medicine & Rehabilitation

## 2023-11-11 DIAGNOSIS — K529 Noninfective gastroenteritis and colitis, unspecified: Secondary | ICD-10-CM | POA: Diagnosis not present

## 2023-11-11 DIAGNOSIS — K219 Gastro-esophageal reflux disease without esophagitis: Secondary | ICD-10-CM | POA: Diagnosis not present

## 2023-12-04 ENCOUNTER — Encounter: Payer: Self-pay | Admitting: Physical Medicine & Rehabilitation

## 2023-12-04 ENCOUNTER — Encounter: Payer: Medicare HMO | Attending: Physical Medicine & Rehabilitation | Admitting: Physical Medicine & Rehabilitation

## 2023-12-04 VITALS — BP 139/66 | HR 65 | Ht 61.0 in | Wt 143.0 lb

## 2023-12-04 DIAGNOSIS — G8929 Other chronic pain: Secondary | ICD-10-CM | POA: Insufficient documentation

## 2023-12-04 DIAGNOSIS — M5442 Lumbago with sciatica, left side: Secondary | ICD-10-CM | POA: Insufficient documentation

## 2023-12-04 DIAGNOSIS — M5441 Lumbago with sciatica, right side: Secondary | ICD-10-CM | POA: Insufficient documentation

## 2023-12-04 NOTE — Progress Notes (Signed)
 Subjective:    Patient ID: Kaitlyn Newton, female    DOB: 09-13-1941, 83 y.o.   MRN: [id number]  HPI  83 year old female kindly referred by Dr. Charlie Dolores with primary complaint of low back pain radiating to the lower extremities.  Consultation requested for the possibility of acupuncture as a treatment for her pain complaints.  The patient has a past medical history significant for hypertension, depression, chronic pain, Additional history as below. Allergies are to Cipro as well as Macrodantin Symptoms gradually worsening  Pain is primarily in the low back region..  Patient also has pain in the posterior thigh area but not below the knees. She has occasional numbness tingling in the feet at night. Pain Inventory Average Pain 8 Pain Right Now 0 My pain is intermittent and aching  In the last 24 hours, has pain interfered with the following? General activity 10 Relation with others 0 Enjoyment of life 5 What TIME of day is your pain at its worst? varies Sleep (in general) Fair  Pain is worse with: walking, standing, and some activites Pain improves with: rest and medication Relief from Meds: 2  walk without assistance ability to climb steps?  yes do you drive?  yes  retired I need assistance with the following:  meal prep, household duties, and shopping  spasms  Any changes since last visit?  no  Any changes since last visit?  no    Family History  Problem Relation Age of Onset   Depression Father    CAD Father    CVA Mother    CAD Mother    Gout Sister    Heart Problems Son        degenerate bones and heart   Other Sister        respiratory issues and lung surgery   Arthritis Son    Social History   Socioeconomic History   Marital status: Married    Spouse name: Not on file   Number of children: Not on file   Years of education: Not on file   Highest education level: Not on file  Occupational History   Not on file  Tobacco Use    Smoking status: Never   Smokeless tobacco: Never  Vaping Use   Vaping status: Never Used  Substance and Sexual Activity   Alcohol use: No    Alcohol/week: 0.0 standard drinks of alcohol   Drug use: No   Sexual activity: Not on file  Other Topics Concern   Not on file  Social History Narrative   Not on file   Social Drivers of Health   Financial Resource Strain: Not on file  Food Insecurity: Patient Declined (07/26/2022)   Hunger Vital Sign    Worried About Running Out of Food in the Last Year: Patient declined    Ran Out of Food in the Last Year: Patient declined  Transportation Needs: Patient Declined (07/26/2022)   PRAPARE - Administrator, Civil Service (Medical): Patient declined    Lack of Transportation (Non-Medical): Patient declined  Physical Activity: Not on file  Stress: Not on file  Social Connections: Not on file   Past Surgical History:  Procedure Laterality Date   APPENDECTOMY     BILATERAL SALPINGOOPHORECTOMY  03/07/2003   BLADDER REPAIR  03/07/2003   anterior repair   BLADDER SUSPENSION     BLADDER SUSPENSION  03/07/2003   SPARC sling   CATARACT EXTRACTION W/ INTRAOCULAR LENS  IMPLANT, BILATERAL Bilateral  DUPUYTREN / PALMAR FASCIOTOMY Right 03/23/2009   ESOPHAGOGASTRODUODENOSCOPY     I & D EXTREMITY N/A 01/02/2017   Procedure: DEBRIDEMENT DIP JOINT;  Surgeon: Franky Curia, MD;  Location: [facility];  Service: Orthopedics;  Laterality: N/A;   MASS EXCISION Left 01/02/2017   Procedure: LEFT LONG FINGER EXCISION MASS;  Surgeon: Franky Curia, MD;  Location: [facility];  Service: Orthopedics;  Laterality: Left;   REFRACTIVE SURGERY Bilateral    REVERSE SHOULDER ARTHROPLASTY Right 07/26/2022   Procedure: REVERSE SHOULDER ARTHROPLASTY;  Surgeon: Kay Kemps, MD;  Location: [facility];  Service: Orthopedics;  Laterality: Right;  with ISB   TONSILLECTOMY     TRIGGER FINGER RELEASE Right 03/23/2009   index and long fingers    VAGINAL HYSTERECTOMY  03/07/2003   WISDOM TOOTH EXTRACTION     Past Medical History:  Diagnosis Date   Anxiety    Arthritis    hands   CKD (chronic kidney disease), stage III ([facility])    no nephrologist   GERD (gastroesophageal reflux disease)    Heart murmur    no cardiologist   High cholesterol    HTN (hypertension)    states under control with meds., has been on med. since 1990s   Mucoid cyst of joint 12/2016   left long finger   PONV (postoperative nausea and vomiting)    after appendectomy with ether   BP 139/66   Pulse 65   Ht 5' 1 (1.549 m)   Wt 143 lb (64.9 kg)   SpO2 96%   BMI 27.02 kg/m   Opioid Risk Score:   Fall Risk Score:  `1  Depression screen [facility] 2/9     12/04/2023    2:18 PM  Depression screen PHQ 2/9  Decreased Interest 0  Down, Depressed, Hopeless 0  PHQ - 2 Score 0  Altered sleeping 0  Tired, decreased energy 0  Change in appetite 0  Feeling bad or failure about yourself  0  Trouble concentrating 0  Moving slowly or fidgety/restless 0  Suicidal thoughts 0  PHQ-9 Score 0     Review of Systems     Objective:   Physical Exam Vitals and nursing note reviewed.  Constitutional:      Appearance: She is normal weight.  HENT:     Head: Normocephalic and atraumatic.  Eyes:     Extraocular Movements: Extraocular movements intact.     Conjunctiva/sclera: Conjunctivae normal.     Pupils: Pupils are equal, round, and reactive to light.  Musculoskeletal:     Right lower leg: No edema.     Left lower leg: No edema.     Comments: No tenderness along the thoracic or lumbar paraspinals no tenderness along the spinous processes.  No PSIS tenderness no tenderness over the greater trochanters of the hip.  No tenderness over the gluteus muscles. No limitation with hip internal or external rotation No evidence of knee effusions bilaterally no pain or limitation with knee range of motion. Ambulates without assistive device no evidence of toe drag or  knee instability.  Skin:    General: Skin is warm and dry.  Neurological:     Mental Status: She is alert.   Sensation is intact in the lower extremities Strength is normal in both lower extremities        Assessment & Plan:   #1.  Chronic lumbar pain radiating to the lower extremities symptoms are suggestive of neurogenic claudication.  No MRI results available but  suspect lumbar spinal stenosis.  A CT scan of the abdomen and pelvis was reviewed focusing on the spine.  Images demonstrate L4 on L5 anterolisthesis.  There is also facet arthropathy noted at L4-5 and L5 and S1. I do think the patient is a good candidate for acupuncture to help with pain management related to lumbar spinal stenosis and lumbar spondylolisthesis. We discussed that this is not a curative treatment but a pain management technique.  We also discussed that weekly visits x 4 are neck necessary to assess the efficacy of treatment.  If further treatments are scheduled the effort is to spread out treatment frequency to every 2 to 4 weeks.  We discussed Medicare limits or acupuncture treatment payments this include 12 visits per year

## 2023-12-30 DIAGNOSIS — E782 Mixed hyperlipidemia: Secondary | ICD-10-CM | POA: Diagnosis not present

## 2023-12-30 DIAGNOSIS — N1831 Chronic kidney disease, stage 3a: Secondary | ICD-10-CM | POA: Diagnosis not present

## 2024-01-09 ENCOUNTER — Encounter: Payer: Medicare HMO | Admitting: Physical Medicine & Rehabilitation

## 2024-01-30 ENCOUNTER — Encounter: Payer: Self-pay | Admitting: Physical Medicine & Rehabilitation

## 2024-01-30 ENCOUNTER — Encounter: Payer: Medicare HMO | Attending: Physical Medicine & Rehabilitation | Admitting: Physical Medicine & Rehabilitation

## 2024-01-30 VITALS — BP 147/68 | HR 83 | Ht 61.0 in | Wt 143.6 lb

## 2024-01-30 DIAGNOSIS — M5442 Lumbago with sciatica, left side: Secondary | ICD-10-CM | POA: Diagnosis not present

## 2024-01-30 DIAGNOSIS — G8929 Other chronic pain: Secondary | ICD-10-CM | POA: Insufficient documentation

## 2024-01-30 DIAGNOSIS — M5441 Lumbago with sciatica, right side: Secondary | ICD-10-CM | POA: Insufficient documentation

## 2024-01-30 NOTE — Progress Notes (Signed)
 Acupuncture treatment #1 Indication lumbar spinal stenosis causing chronic bilateral low back pain with sciatic  Pain  Governing vessel 4  Bladder 25 bladder 26 bilaterally Gallbladder 30 bilaterally Bladder 40 bilaterally  Electrical stimulation 2.5 Hz x 25 minutes between bilateral bladder 25 and the 20 bilateral gallbladder 30 and bladder 40  Patient tolerated procedure well repeat treatment in 1 week

## 2024-02-02 DIAGNOSIS — Z1331 Encounter for screening for depression: Secondary | ICD-10-CM | POA: Diagnosis not present

## 2024-02-02 DIAGNOSIS — Z6827 Body mass index (BMI) 27.0-27.9, adult: Secondary | ICD-10-CM | POA: Diagnosis not present

## 2024-02-02 DIAGNOSIS — Z0000 Encounter for general adult medical examination without abnormal findings: Secondary | ICD-10-CM | POA: Diagnosis not present

## 2024-02-06 ENCOUNTER — Encounter: Payer: Self-pay | Admitting: Physical Medicine & Rehabilitation

## 2024-02-06 ENCOUNTER — Encounter: Payer: Medicare HMO | Admitting: Physical Medicine & Rehabilitation

## 2024-02-06 VITALS — BP 150/71 | HR 67 | Ht 61.0 in | Wt 145.0 lb

## 2024-02-06 DIAGNOSIS — M5442 Lumbago with sciatica, left side: Secondary | ICD-10-CM | POA: Diagnosis not present

## 2024-02-06 DIAGNOSIS — G8929 Other chronic pain: Secondary | ICD-10-CM | POA: Diagnosis not present

## 2024-02-06 DIAGNOSIS — M5441 Lumbago with sciatica, right side: Secondary | ICD-10-CM | POA: Diagnosis not present

## 2024-02-06 NOTE — Progress Notes (Signed)
 Acupuncture treatment #2 Indication lumbar spinal stenosis causing chronic bilateral low back pain with sciatic  Pain  Governing vessel 4  Bladder 25 bladder 26 bilaterally Gallbladder 30 bilaterally Bladder 40 bilaterally  Electrical stimulation 2.5 Hz x 25 minutes between bilateral bladder 25 and  bilateral gallbladder 30 and  bladder 26 to bladder 40  Patient tolerated procedure well repeat treatment in 1 week

## 2024-02-12 ENCOUNTER — Encounter: Payer: Medicare HMO | Admitting: Physical Medicine & Rehabilitation

## 2024-02-12 ENCOUNTER — Encounter: Payer: Self-pay | Admitting: Physical Medicine & Rehabilitation

## 2024-02-12 VITALS — BP 145/71 | HR 70 | Ht 61.0 in | Wt 144.8 lb

## 2024-02-12 DIAGNOSIS — M5442 Lumbago with sciatica, left side: Secondary | ICD-10-CM | POA: Diagnosis not present

## 2024-02-12 DIAGNOSIS — M5441 Lumbago with sciatica, right side: Secondary | ICD-10-CM | POA: Diagnosis not present

## 2024-02-12 DIAGNOSIS — G8929 Other chronic pain: Secondary | ICD-10-CM | POA: Diagnosis not present

## 2024-02-12 NOTE — Progress Notes (Signed)
 Acupuncture treatment #3 Indication lumbar spinal stenosis causing chronic bilateral low back pain with sciatic  Pain  Governing vessel 4  Bladder 25 bladder 26 bilaterally Gallbladder 30 bilaterally Bladder 40 bilaterally  Electrical stimulation 2.5 Hz x 25 minutes between bilateral bladder 25 and  bilateral gallbladder 30 and  bladder 26 to bladder 40  Patient tolerated procedure well repeat treatment in 1 week

## 2024-02-19 ENCOUNTER — Encounter: Admitting: Physical Medicine & Rehabilitation

## 2024-02-19 ENCOUNTER — Encounter: Payer: Self-pay | Admitting: Physical Medicine & Rehabilitation

## 2024-02-19 VITALS — BP 149/70 | HR 65 | Ht 61.0 in | Wt 144.2 lb

## 2024-02-19 DIAGNOSIS — M5442 Lumbago with sciatica, left side: Secondary | ICD-10-CM | POA: Diagnosis not present

## 2024-02-19 DIAGNOSIS — M5441 Lumbago with sciatica, right side: Secondary | ICD-10-CM

## 2024-02-19 DIAGNOSIS — G8929 Other chronic pain: Secondary | ICD-10-CM

## 2024-02-19 NOTE — Progress Notes (Signed)
 Acupuncture treatment #4 Indication lumbar spinal stenosis causing chronic bilateral low back pain with sciatic  Pain  Governing vessel 4  Bladder 25 bladder 26 bilaterally Gallbladder 30 bilaterally RIght GB 30' Bladder 40 bilaterally  Electrical stimulation 2.5 Hz x 25 minutes between bilateral bladder 25 and  bilateral gallbladder 30 and  bladder 26 to bladder 40  Patient tolerated procedure well repeat treatment in 1 week

## 2024-02-19 NOTE — Patient Instructions (Addendum)
 Please call if this last acupuncture.  Treatments helped your pain for at least 1-2 days, we can schedule additional treatments

## 2024-03-24 DIAGNOSIS — M5416 Radiculopathy, lumbar region: Secondary | ICD-10-CM | POA: Diagnosis not present

## 2024-04-13 DIAGNOSIS — M5416 Radiculopathy, lumbar region: Secondary | ICD-10-CM | POA: Diagnosis not present

## 2024-04-26 DIAGNOSIS — E781 Pure hyperglyceridemia: Secondary | ICD-10-CM | POA: Diagnosis not present

## 2024-04-26 DIAGNOSIS — F411 Generalized anxiety disorder: Secondary | ICD-10-CM | POA: Diagnosis not present

## 2024-04-26 DIAGNOSIS — I10 Essential (primary) hypertension: Secondary | ICD-10-CM | POA: Diagnosis not present

## 2024-04-26 DIAGNOSIS — Z8262 Family history of osteoporosis: Secondary | ICD-10-CM | POA: Diagnosis not present

## 2024-04-26 DIAGNOSIS — M85852 Other specified disorders of bone density and structure, left thigh: Secondary | ICD-10-CM | POA: Diagnosis not present

## 2024-04-26 DIAGNOSIS — M8588 Other specified disorders of bone density and structure, other site: Secondary | ICD-10-CM | POA: Diagnosis not present

## 2024-04-26 DIAGNOSIS — N1832 Chronic kidney disease, stage 3b: Secondary | ICD-10-CM | POA: Diagnosis not present

## 2024-04-26 DIAGNOSIS — Z1231 Encounter for screening mammogram for malignant neoplasm of breast: Secondary | ICD-10-CM | POA: Diagnosis not present

## 2024-05-11 DIAGNOSIS — L821 Other seborrheic keratosis: Secondary | ICD-10-CM | POA: Diagnosis not present

## 2024-05-11 DIAGNOSIS — D225 Melanocytic nevi of trunk: Secondary | ICD-10-CM | POA: Diagnosis not present

## 2024-05-27 DIAGNOSIS — E781 Pure hyperglyceridemia: Secondary | ICD-10-CM | POA: Diagnosis not present

## 2024-05-27 DIAGNOSIS — I10 Essential (primary) hypertension: Secondary | ICD-10-CM | POA: Diagnosis not present

## 2024-05-27 DIAGNOSIS — F411 Generalized anxiety disorder: Secondary | ICD-10-CM | POA: Diagnosis not present

## 2024-05-27 DIAGNOSIS — N1832 Chronic kidney disease, stage 3b: Secondary | ICD-10-CM | POA: Diagnosis not present

## 2024-06-23 DIAGNOSIS — K136 Irritative hyperplasia of oral mucosa: Secondary | ICD-10-CM | POA: Diagnosis not present

## 2024-06-23 DIAGNOSIS — T458X5A Adverse effect of other primarily systemic and hematological agents, initial encounter: Secondary | ICD-10-CM | POA: Diagnosis not present

## 2024-06-23 DIAGNOSIS — M87180 Osteonecrosis due to drugs, jaw: Secondary | ICD-10-CM | POA: Diagnosis not present

## 2024-06-27 DIAGNOSIS — N1832 Chronic kidney disease, stage 3b: Secondary | ICD-10-CM | POA: Diagnosis not present

## 2024-06-27 DIAGNOSIS — I10 Essential (primary) hypertension: Secondary | ICD-10-CM | POA: Diagnosis not present

## 2024-06-27 DIAGNOSIS — F411 Generalized anxiety disorder: Secondary | ICD-10-CM | POA: Diagnosis not present

## 2024-06-27 DIAGNOSIS — E781 Pure hyperglyceridemia: Secondary | ICD-10-CM | POA: Diagnosis not present

## 2024-06-29 DIAGNOSIS — E538 Deficiency of other specified B group vitamins: Secondary | ICD-10-CM | POA: Diagnosis not present

## 2024-06-29 DIAGNOSIS — M85852 Other specified disorders of bone density and structure, left thigh: Secondary | ICD-10-CM | POA: Diagnosis not present

## 2024-06-29 DIAGNOSIS — E782 Mixed hyperlipidemia: Secondary | ICD-10-CM | POA: Diagnosis not present

## 2024-06-29 DIAGNOSIS — N1831 Chronic kidney disease, stage 3a: Secondary | ICD-10-CM | POA: Diagnosis not present

## 2024-08-03 DIAGNOSIS — H52223 Regular astigmatism, bilateral: Secondary | ICD-10-CM | POA: Diagnosis not present

## 2024-08-18 DIAGNOSIS — M5416 Radiculopathy, lumbar region: Secondary | ICD-10-CM | POA: Diagnosis not present

## 2024-08-24 DIAGNOSIS — M5416 Radiculopathy, lumbar region: Secondary | ICD-10-CM | POA: Diagnosis not present

## 2024-09-14 DIAGNOSIS — M5117 Intervertebral disc disorders with radiculopathy, lumbosacral region: Secondary | ICD-10-CM | POA: Diagnosis not present

## 2024-09-14 DIAGNOSIS — M4804 Spinal stenosis, thoracic region: Secondary | ICD-10-CM | POA: Diagnosis not present

## 2024-09-14 DIAGNOSIS — M4725 Other spondylosis with radiculopathy, thoracolumbar region: Secondary | ICD-10-CM | POA: Diagnosis not present

## 2024-09-14 DIAGNOSIS — M419 Scoliosis, unspecified: Secondary | ICD-10-CM | POA: Diagnosis not present

## 2024-09-14 DIAGNOSIS — M4319 Spondylolisthesis, multiple sites in spine: Secondary | ICD-10-CM | POA: Diagnosis not present

## 2024-09-14 DIAGNOSIS — M5115 Intervertebral disc disorders with radiculopathy, thoracolumbar region: Secondary | ICD-10-CM | POA: Diagnosis not present

## 2024-09-14 DIAGNOSIS — M48061 Spinal stenosis, lumbar region without neurogenic claudication: Secondary | ICD-10-CM | POA: Diagnosis not present

## 2024-09-14 DIAGNOSIS — M4727 Other spondylosis with radiculopathy, lumbosacral region: Secondary | ICD-10-CM | POA: Diagnosis not present

## 2024-09-14 DIAGNOSIS — M4805 Spinal stenosis, thoracolumbar region: Secondary | ICD-10-CM | POA: Diagnosis not present

## 2024-09-14 DIAGNOSIS — M5116 Intervertebral disc disorders with radiculopathy, lumbar region: Secondary | ICD-10-CM | POA: Diagnosis not present

## 2024-09-14 DIAGNOSIS — M4726 Other spondylosis with radiculopathy, lumbar region: Secondary | ICD-10-CM | POA: Diagnosis not present

## 2024-09-14 DIAGNOSIS — M4807 Spinal stenosis, lumbosacral region: Secondary | ICD-10-CM | POA: Diagnosis not present

## 2024-09-28 DIAGNOSIS — M5416 Radiculopathy, lumbar region: Secondary | ICD-10-CM | POA: Diagnosis not present

## 2024-09-28 DIAGNOSIS — M419 Scoliosis, unspecified: Secondary | ICD-10-CM | POA: Diagnosis not present

## 2024-09-28 DIAGNOSIS — M48062 Spinal stenosis, lumbar region with neurogenic claudication: Secondary | ICD-10-CM | POA: Diagnosis not present

## 2024-09-28 DIAGNOSIS — R29898 Other symptoms and signs involving the musculoskeletal system: Secondary | ICD-10-CM | POA: Diagnosis not present

## 2024-09-28 DIAGNOSIS — M47816 Spondylosis without myelopathy or radiculopathy, lumbar region: Secondary | ICD-10-CM | POA: Diagnosis not present

## 2024-09-28 DIAGNOSIS — M4804 Spinal stenosis, thoracic region: Secondary | ICD-10-CM | POA: Diagnosis not present

## 2024-10-08 DIAGNOSIS — H26491 Other secondary cataract, right eye: Secondary | ICD-10-CM | POA: Diagnosis not present

## 2024-10-08 DIAGNOSIS — H18513 Endothelial corneal dystrophy, bilateral: Secondary | ICD-10-CM | POA: Diagnosis not present

## 2024-10-08 DIAGNOSIS — H26493 Other secondary cataract, bilateral: Secondary | ICD-10-CM | POA: Diagnosis not present

## 2024-10-08 DIAGNOSIS — H401131 Primary open-angle glaucoma, bilateral, mild stage: Secondary | ICD-10-CM | POA: Diagnosis not present

## 2024-10-08 DIAGNOSIS — Z961 Presence of intraocular lens: Secondary | ICD-10-CM | POA: Diagnosis not present

## 2024-10-12 DIAGNOSIS — M5134 Other intervertebral disc degeneration, thoracic region: Secondary | ICD-10-CM | POA: Diagnosis not present

## 2024-10-12 DIAGNOSIS — M4804 Spinal stenosis, thoracic region: Secondary | ICD-10-CM | POA: Diagnosis not present

## 2024-10-12 DIAGNOSIS — M47814 Spondylosis without myelopathy or radiculopathy, thoracic region: Secondary | ICD-10-CM | POA: Diagnosis not present

## 2024-10-12 DIAGNOSIS — M419 Scoliosis, unspecified: Secondary | ICD-10-CM | POA: Diagnosis not present

## 2024-10-13 DIAGNOSIS — H40053 Ocular hypertension, bilateral: Secondary | ICD-10-CM | POA: Diagnosis not present

## 2024-10-13 DIAGNOSIS — H26491 Other secondary cataract, right eye: Secondary | ICD-10-CM | POA: Diagnosis not present

## 2024-10-13 DIAGNOSIS — H40033 Anatomical narrow angle, bilateral: Secondary | ICD-10-CM | POA: Diagnosis not present
